# Patient Record
Sex: Male | Born: 1975 | Race: White | Hispanic: No | Marital: Married | Smoking: Current every day smoker
Health system: Southern US, Community
[De-identification: ages and names within clinical notes are randomized; demographics above are authoritative.]

## PROBLEM LIST (undated history)

## (undated) DIAGNOSIS — J45909 Unspecified asthma, uncomplicated: Secondary | ICD-10-CM

---

## 2005-12-01 ENCOUNTER — Emergency Department (HOSPITAL_COMMUNITY): Admission: EM | Admit: 2005-12-01 | Discharge: 2005-12-01 | Payer: Self-pay | Admitting: Emergency Medicine

## 2007-02-18 ENCOUNTER — Emergency Department (HOSPITAL_COMMUNITY): Admission: EM | Admit: 2007-02-18 | Discharge: 2007-02-18 | Payer: Self-pay | Admitting: Family Medicine

## 2007-03-04 ENCOUNTER — Emergency Department (HOSPITAL_COMMUNITY): Admission: EM | Admit: 2007-03-04 | Discharge: 2007-03-04 | Payer: Self-pay | Admitting: Emergency Medicine

## 2008-03-09 ENCOUNTER — Emergency Department (HOSPITAL_COMMUNITY): Admission: EM | Admit: 2008-03-09 | Discharge: 2008-03-09 | Payer: Self-pay | Admitting: Family Medicine

## 2008-03-14 ENCOUNTER — Emergency Department (HOSPITAL_COMMUNITY): Admission: EM | Admit: 2008-03-14 | Discharge: 2008-03-14 | Payer: Self-pay | Admitting: Emergency Medicine

## 2008-04-04 ENCOUNTER — Emergency Department (HOSPITAL_COMMUNITY): Admission: EM | Admit: 2008-04-04 | Discharge: 2008-04-04 | Payer: Self-pay | Admitting: Emergency Medicine

## 2009-03-21 ENCOUNTER — Emergency Department (HOSPITAL_COMMUNITY): Admission: EM | Admit: 2009-03-21 | Discharge: 2009-03-21 | Payer: Self-pay | Admitting: Family Medicine

## 2012-12-09 ENCOUNTER — Encounter (HOSPITAL_COMMUNITY): Payer: Self-pay | Admitting: Emergency Medicine

## 2012-12-09 ENCOUNTER — Emergency Department (HOSPITAL_COMMUNITY)
Admission: EM | Admit: 2012-12-09 | Discharge: 2012-12-09 | Disposition: A | Discharge status: Home / Self Care | Attending: Emergency Medicine | Admitting: Emergency Medicine

## 2012-12-09 ENCOUNTER — Emergency Department (HOSPITAL_COMMUNITY)

## 2012-12-09 DIAGNOSIS — S62339A Displaced fracture of neck of unspecified metacarpal bone, initial encounter for closed fracture: Secondary | ICD-10-CM | POA: Insufficient documentation

## 2012-12-09 DIAGNOSIS — Z79899 Other long term (current) drug therapy: Secondary | ICD-10-CM | POA: Insufficient documentation

## 2012-12-09 DIAGNOSIS — J45901 Unspecified asthma with (acute) exacerbation: Secondary | ICD-10-CM | POA: Insufficient documentation

## 2012-12-09 DIAGNOSIS — S62306A Unspecified fracture of fifth metacarpal bone, right hand, initial encounter for closed fracture: Secondary | ICD-10-CM

## 2012-12-09 HISTORY — DX: Unspecified asthma, uncomplicated: J45.909

## 2012-12-09 MED ORDER — IBUPROFEN 800 MG PO TABS
800.0000 mg | ORAL_TABLET | Freq: Once | ORAL | Status: AC
  Administered 2012-12-09: 800 mg via ORAL
  Filled 2012-12-09: qty 1
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
## 2012-12-18 NOTE — ED Provider Notes (Signed)
Medical screening examination/treatment/procedure(s) were performed by non-physician practitioner and as supervising physician I was immediately available for consultation/collaboration.  Flint Melter, MD 12/18/12 [phone]

## 2015-09-11 ENCOUNTER — Emergency Department (HOSPITAL_COMMUNITY)

## 2015-09-11 ENCOUNTER — Encounter (HOSPITAL_COMMUNITY): Payer: Self-pay | Admitting: *Deleted

## 2015-09-11 ENCOUNTER — Inpatient Hospital Stay (HOSPITAL_COMMUNITY)
Admission: EM | Admit: 2015-09-11 | Discharge: 2015-09-12 | DRG: 071 | Disposition: A | Discharge status: Home / Self Care | Attending: Family Medicine | Admitting: Family Medicine

## 2015-09-11 DIAGNOSIS — I959 Hypotension, unspecified: Secondary | ICD-10-CM | POA: Diagnosis present

## 2015-09-11 DIAGNOSIS — R6510 Systemic inflammatory response syndrome (SIRS) of non-infectious origin without acute organ dysfunction: Secondary | ICD-10-CM | POA: Diagnosis present

## 2015-09-11 DIAGNOSIS — F1910 Other psychoactive substance abuse, uncomplicated: Secondary | ICD-10-CM | POA: Diagnosis not present

## 2015-09-11 DIAGNOSIS — R404 Transient alteration of awareness: Secondary | ICD-10-CM | POA: Diagnosis not present

## 2015-09-11 DIAGNOSIS — R4182 Altered mental status, unspecified: Secondary | ICD-10-CM | POA: Diagnosis present

## 2015-09-11 DIAGNOSIS — F17200 Nicotine dependence, unspecified, uncomplicated: Secondary | ICD-10-CM | POA: Insufficient documentation

## 2015-09-11 DIAGNOSIS — G4710 Hypersomnia, unspecified: Secondary | ICD-10-CM | POA: Diagnosis present

## 2015-09-11 DIAGNOSIS — J45909 Unspecified asthma, uncomplicated: Secondary | ICD-10-CM | POA: Diagnosis present

## 2015-09-11 DIAGNOSIS — G9340 Encephalopathy, unspecified: Principal | ICD-10-CM | POA: Diagnosis present

## 2015-09-11 DIAGNOSIS — R680 Hypothermia, not associated with low environmental temperature: Secondary | ICD-10-CM | POA: Diagnosis present

## 2015-09-11 DIAGNOSIS — R001 Bradycardia, unspecified: Secondary | ICD-10-CM

## 2015-09-11 DIAGNOSIS — T68XXXA Hypothermia, initial encounter: Secondary | ICD-10-CM | POA: Diagnosis not present

## 2015-09-11 DIAGNOSIS — F17210 Nicotine dependence, cigarettes, uncomplicated: Secondary | ICD-10-CM | POA: Diagnosis present

## 2015-09-11 DIAGNOSIS — T68XXXD Hypothermia, subsequent encounter: Secondary | ICD-10-CM | POA: Diagnosis not present

## 2015-09-11 DIAGNOSIS — F1410 Cocaine abuse, uncomplicated: Secondary | ICD-10-CM | POA: Diagnosis present

## 2015-09-11 LAB — URINALYSIS, ROUTINE W REFLEX MICROSCOPIC
Bilirubin Urine: NEGATIVE
Glucose, UA: NEGATIVE mg/dL
Hgb urine dipstick: NEGATIVE
Ketones, ur: NEGATIVE mg/dL
Leukocytes, UA: NEGATIVE
Nitrite: NEGATIVE
Protein, ur: NEGATIVE mg/dL
Specific Gravity, Urine: 1.015 (ref 1.005–1.030)
pH: 6 (ref 5.0–8.0)

## 2015-09-11 LAB — I-STAT CG4 LACTIC ACID, ED: Lactic Acid, Venous: 1.62 mmol/L (ref 0.5–2.0)

## 2015-09-11 LAB — CBC WITH DIFFERENTIAL/PLATELET
BASOS ABS: 0 10*3/uL (ref 0.0–0.1)
BASOS PCT: 0 %
EOS ABS: 0.2 10*3/uL (ref 0.0–0.7)
Eosinophils Relative: 1 %
HCT: 46.4 % (ref 39.0–52.0)
Hemoglobin: 15.3 g/dL (ref 13.0–17.0)
Lymphocytes Relative: 21 %
Lymphs Abs: 2.9 10*3/uL (ref 0.7–4.0)
MCH: 29.4 pg (ref 26.0–34.0)
MCHC: 33 g/dL (ref 30.0–36.0)
MCV: 89.2 fL (ref 78.0–100.0)
MONO ABS: 1.1 10*3/uL — AB (ref 0.1–1.0)
MONOS PCT: 8 %
Neutro Abs: 9.6 10*3/uL — ABNORMAL HIGH (ref 1.7–7.7)
Neutrophils Relative %: 70 %
Platelets: 207 10*3/uL (ref 150–400)
RBC: 5.2 MIL/uL (ref 4.22–5.81)
RDW: 12.4 % (ref 11.5–15.5)
WBC: 13.8 10*3/uL — ABNORMAL HIGH (ref 4.0–10.5)

## 2015-09-11 LAB — COMPREHENSIVE METABOLIC PANEL WITH GFR
ALT: 13 U/L — ABNORMAL LOW (ref 17–63)
AST: 20 U/L (ref 15–41)
Albumin: 3.6 g/dL (ref 3.5–5.0)
Alkaline Phosphatase: 39 U/L (ref 38–126)
Anion gap: 10 (ref 5–15)
BUN: 11 mg/dL (ref 6–20)
CO2: 25 mmol/L (ref 22–32)
Calcium: 9 mg/dL (ref 8.9–10.3)
Chloride: 106 mmol/L (ref 101–111)
Creatinine, Ser: 1.23 mg/dL (ref 0.61–1.24)
GFR calc Af Amer: >60 mL/min (ref >60)
GFR calc non Af Amer: >60 mL/min (ref >60)
Glucose, Bld: 85 mg/dL (ref 65–99)
Potassium: 4.2 mmol/L (ref 3.5–5.1)
Sodium: 141 mmol/L (ref 135–145)
Total Bilirubin: 0.9 mg/dL (ref 0.3–1.2)
Total Protein: 5.8 g/dL — ABNORMAL LOW (ref 6.5–8.1)

## 2015-09-11 LAB — RAPID URINE DRUG SCREEN, HOSP PERFORMED
Amphetamines: NOT DETECTED
Barbiturates: NOT DETECTED
Benzodiazepines: POSITIVE — AB
Cocaine: POSITIVE — AB
Opiates: NOT DETECTED
Tetrahydrocannabinol: POSITIVE — AB

## 2015-09-11 LAB — TROPONIN I
Troponin I: <0.03 ng/mL (ref <0.031)
Troponin I: <0.03 ng/mL (ref <0.031)

## 2015-09-11 LAB — CBG MONITORING, ED: Glucose-Capillary: 74 mg/dL (ref 65–99)

## 2015-09-11 LAB — LACTIC ACID, PLASMA: Lactic Acid, Venous: 0.8 mmol/L (ref 0.5–2.0)

## 2015-09-11 LAB — PROTIME-INR
INR: 1.11 (ref 0.00–1.49)
PROTHROMBIN TIME: 14.5 s (ref 11.6–15.2)

## 2015-09-11 LAB — ETHANOL: Alcohol, Ethyl (B): <5 mg/dL (ref <5)

## 2015-09-11 LAB — MRSA PCR SCREENING: MRSA by PCR: NEGATIVE

## 2015-09-11 MED ORDER — SODIUM CHLORIDE 0.9 % IV BOLUS (SEPSIS)
1000.0000 mL | Freq: Once | INTRAVENOUS | Status: AC
  Administered 2015-09-11: 1000 mL via INTRAVENOUS

## 2015-09-11 MED ORDER — PIPERACILLIN-TAZOBACTAM 3.375 G IVPB
3.3750 g | Freq: Three times a day (TID) | INTRAVENOUS | Status: DC
  Administered 2015-09-12 (×2): 3.375 g via INTRAVENOUS
  Filled 2015-09-11 (×3): qty 50

## 2015-09-11 MED ORDER — ACETAMINOPHEN 650 MG RE SUPP: 650.0000 mg | Freq: Four times a day (QID) | RECTAL | Status: DC | PRN

## 2015-09-11 MED ORDER — ADULT MULTIVITAMIN W/MINERALS CH
1.0000 | ORAL_TABLET | Freq: Every day | ORAL | Status: DC
  Administered 2015-09-11 – 2015-09-12 (×2): 1 via ORAL
  Filled 2015-09-11 (×2): qty 1

## 2015-09-11 MED ORDER — HYDROMORPHONE HCL 1 MG/ML IJ SOLN: 1.0000 mg | Freq: Once | INTRAMUSCULAR | Status: DC

## 2015-09-11 MED ORDER — ACETAMINOPHEN 325 MG PO TABS
650.0000 mg | ORAL_TABLET | Freq: Four times a day (QID) | ORAL | Status: DC | PRN
  Administered 2015-09-12 (×2): 650 mg via ORAL
  Filled 2015-09-11 (×2): qty 2

## 2015-09-11 MED ORDER — VANCOMYCIN HCL IN DEXTROSE 1-5 GM/200ML-% IV SOLN
1000.0000 mg | Freq: Three times a day (TID) | INTRAVENOUS | Status: DC
  Administered 2015-09-12 (×2): 1000 mg via INTRAVENOUS
  Filled 2015-09-11 (×4): qty 200

## 2015-09-11 MED ORDER — ALBUTEROL SULFATE (2.5 MG/3ML) 0.083% IN NEBU
2.5000 mg | INHALATION_SOLUTION | Freq: Four times a day (QID) | RESPIRATORY_TRACT | Status: DC | PRN
  Administered 2015-09-11: 2.5 mg via RESPIRATORY_TRACT
  Filled 2015-09-11: qty 3

## 2015-09-11 MED ORDER — SODIUM CHLORIDE 0.9% FLUSH
3.0000 mL | Freq: Two times a day (BID) | INTRAVENOUS | Status: DC
Start: 2015-09-11 — End: 2015-09-13

## 2015-09-11 MED ORDER — SODIUM CHLORIDE 0.9 % IV SOLN
INTRAVENOUS | Status: DC
  Administered 2015-09-11: 125 mL/h via INTRAVENOUS
  Administered 2015-09-12: 07:00:00 via INTRAVENOUS

## 2015-09-11 MED ORDER — SODIUM CHLORIDE 0.9 % IV SOLN
INTRAVENOUS | Status: AC
  Administered 2015-09-11: 18:00:00 via INTRAVENOUS

## 2015-09-11 MED ORDER — FOLIC ACID 1 MG PO TABS
1.0000 mg | ORAL_TABLET | Freq: Every day | ORAL | Status: DC
  Administered 2015-09-11 – 2015-09-12 (×2): 1 mg via ORAL
  Filled 2015-09-11 (×2): qty 1

## 2015-09-11 MED ORDER — ENOXAPARIN SODIUM 40 MG/0.4ML ~~LOC~~ SOLN
40.0000 mg | SUBCUTANEOUS | Status: DC
Start: 2015-09-11 — End: 2015-09-13
  Administered 2015-09-11: 40 mg via SUBCUTANEOUS
  Filled 2015-09-11: qty 0.4

## 2015-09-11 MED ORDER — PIPERACILLIN-TAZOBACTAM 3.375 G IVPB 30 MIN
3.3750 g | Freq: Once | INTRAVENOUS | Status: AC
  Administered 2015-09-11: 3.375 g via INTRAVENOUS
  Filled 2015-09-11: qty 50

## 2015-09-11 MED ORDER — NALOXONE HCL 0.4 MG/ML IJ SOLN
0.4000 mg | Freq: Once | INTRAMUSCULAR | Status: AC
  Administered 2015-09-11: 0.4 mg via INTRAVENOUS
  Filled 2015-09-11: qty 1

## 2015-09-11 MED ORDER — VANCOMYCIN HCL 10 G IV SOLR
2000.0000 mg | Freq: Once | INTRAVENOUS | Status: AC
  Administered 2015-09-11: 2000 mg via INTRAVENOUS
  Filled 2015-09-11: qty 2000

## 2015-09-11 MED ORDER — VITAMIN B-1 100 MG PO TABS
100.0000 mg | ORAL_TABLET | Freq: Every day | ORAL | Status: DC
  Administered 2015-09-11 – 2015-09-12 (×2): 100 mg via ORAL
  Filled 2015-09-11 (×2): qty 1

## 2015-09-11 MED ORDER — THIAMINE HCL 100 MG/ML IJ SOLN: 100.0000 mg | Freq: Every day | INTRAMUSCULAR | Status: DC

## 2015-09-11 NOTE — ED Notes (Signed)
Pt back from CT.  Attempting to give urine sample.

## 2015-09-11 NOTE — ED Notes (Signed)
Pt wife requested a [place] sandwich, apple sauce and coke for herself. OK per RN Steward DroneBrenda pt wife given a [place] sandwich, apple sauce and coke

## 2015-09-11 NOTE — ED Notes (Signed)
Family at bedside.  They state pt probably overdosed on xanax.  Dr Jeraldine LootsLockwood notified.

## 2015-09-11 NOTE — ED Notes (Signed)
Gerilyn PilgrimJacob Laye [phone].

## 2015-09-11 NOTE — Progress Notes (Signed)
Pharmacy Antibiotic Note  Wesley Larson is a [age] y.o. male admitted on 09/11/2015.  Pharmacy has been consulted for vancomycin and zosyn dosing. Pt is hypothermic and WBC is elevated at 13.8. SCr is 1.23 and lactic acid is 1.62.   Plan: - Vancomycin 2gm IV x 1 then 1gm IV Q8H  - Zosyn 3.375gm IV Q8H (4 hr inf) - F/u renal fxn, C&S, clinical status and trough at SS  Height: 6\' 1"  (185.4 cm) Weight: 215 lb (97.523 kg) IBW/kg (Calculated) : 79.9  Temp (24hrs), Avg:95.8 F (35.4 C), Min:95.8 F (35.4 C), Max:95.8 F (35.4 C)   Recent Labs Lab 09/11/15 1255 09/11/15 1302 09/11/15 1554  WBC 13.8*  --   --   CREATININE 1.23  --   --   LATICACIDVEN  --  1.62 0.8    Estimated Creatinine Clearance: 100.1 mL/min (by C-G formula based on Cr of 1.23).    No Known Allergies  Antimicrobials this admission: Vanc 5/20>> Zosyn 5/20>>  Dose adjustments this admission: N/A  Microbiology results: Pending  Thank you for allowing pharmacy to be a part of this patient's care.  Terrina Docter, Drake LeachRachel Lynn 09/11/2015 4:58 PM
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
## 2015-09-11 NOTE — ED Notes (Signed)
No reaction with narcan.

## 2015-09-11 NOTE — ED Notes (Signed)
Attempted report 

## 2015-09-11 NOTE — ED Notes (Signed)
Admitting MD at bedside.  No neurological improvements/no changes.
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
## 2015-09-12 DIAGNOSIS — R001 Bradycardia, unspecified: Secondary | ICD-10-CM | POA: Diagnosis not present

## 2015-09-12 DIAGNOSIS — T68XXXD Hypothermia, subsequent encounter: Secondary | ICD-10-CM | POA: Diagnosis not present

## 2015-09-12 DIAGNOSIS — F17200 Nicotine dependence, unspecified, uncomplicated: Secondary | ICD-10-CM

## 2015-09-12 DIAGNOSIS — R404 Transient alteration of awareness: Secondary | ICD-10-CM | POA: Diagnosis not present

## 2015-09-12 DIAGNOSIS — F1910 Other psychoactive substance abuse, uncomplicated: Secondary | ICD-10-CM | POA: Insufficient documentation

## 2015-09-12 LAB — HIV ANTIBODY (ROUTINE TESTING W REFLEX): HIV SCREEN 4TH GENERATION: NONREACTIVE

## 2015-09-12 LAB — CBC
HCT: 42.1 % (ref 39.0–52.0)
HEMOGLOBIN: 13.4 g/dL (ref 13.0–17.0)
MCH: 28.3 pg (ref 26.0–34.0)
MCHC: 31.8 g/dL (ref 30.0–36.0)
MCV: 89 fL (ref 78.0–100.0)
Platelets: 189 10*3/uL (ref 150–400)
RBC: 4.73 MIL/uL (ref 4.22–5.81)
RDW: 12.8 % (ref 11.5–15.5)
WBC: 6.7 10*3/uL (ref 4.0–10.5)

## 2015-09-12 LAB — BASIC METABOLIC PANEL
ANION GAP: 7 (ref 5–15)
BUN: 8 mg/dL (ref 6–20)
CALCIUM: 8.2 mg/dL — AB (ref 8.9–10.3)
CHLORIDE: 108 mmol/L (ref 101–111)
CO2: 26 mmol/L (ref 22–32)
Creatinine, Ser: 1.3 mg/dL — ABNORMAL HIGH (ref 0.61–1.24)
GFR calc non Af Amer: >60 mL/min (ref >60)
Glucose, Bld: 115 mg/dL — ABNORMAL HIGH (ref 65–99)
Potassium: 4 mmol/L (ref 3.5–5.1)
Sodium: 141 mmol/L (ref 135–145)

## 2015-09-12 LAB — FOLATE: Folate: 39 ng/mL (ref >5.9)

## 2015-09-12 LAB — RPR: RPR: NONREACTIVE

## 2015-09-12 LAB — TROPONIN I

## 2015-09-12 LAB — TSH: TSH: 1.784 u[IU]/mL (ref 0.350–4.500)

## 2015-09-12 LAB — VITAMIN B12: Vitamin B-12: 196 pg/mL (ref 180–914)

## 2015-09-12 MED ORDER — NICOTINE POLACRILEX 2 MG MT GUM
2.0000 mg | CHEWING_GUM | OROMUCOSAL | Status: DC | PRN
  Filled 2015-09-12: qty 1

## 2015-09-12 MED ORDER — LORAZEPAM 1 MG PO TABS: 1.0000 mg | ORAL_TABLET | Freq: Four times a day (QID) | ORAL | Status: DC | PRN

## 2015-09-12 MED ORDER — ACETAMINOPHEN 325 MG PO TABS: 650.0000 mg | ORAL_TABLET | Freq: Four times a day (QID) | ORAL | Status: DC | PRN

## 2015-09-12 MED ORDER — TRAMADOL HCL 50 MG PO TABS
50.0000 mg | ORAL_TABLET | Freq: Four times a day (QID) | ORAL | Status: DC | PRN
  Administered 2015-09-12: 50 mg via ORAL
  Filled 2015-09-12: qty 1

## 2015-09-12 MED ORDER — NICOTINE 21 MG/24HR TD PT24
21.0000 mg | MEDICATED_PATCH | Freq: Every day | TRANSDERMAL | Status: DC
  Administered 2015-09-12: 21 mg via TRANSDERMAL
  Filled 2015-09-12: qty 1

## 2015-09-12 MED ORDER — LORAZEPAM 2 MG/ML IJ SOLN: 1.0000 mg | Freq: Four times a day (QID) | INTRAMUSCULAR | Status: DC | PRN

## 2015-09-12 NOTE — Progress Notes (Signed)
Utilization review completed.  

## 2015-09-12 NOTE — Progress Notes (Signed)
Patient's IV discontinued, Tele discontinued, CCMD notified, went over the discharge instructions, understood and acknowledged.  He doesn't have a ride and wants to have cab voucher, on coming nurse notified.  His brother is not lifting the phone per patient.
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
## 2015-09-12 NOTE — Discharge Instructions (Signed)
You were admitted for altered mental status.  You had imaging of your brain that showed no acute processes/ injury.  We think that your altered mental status was secondary to substance you and highly encourage you to seek help for this.  Please establish with a primary care doctor so that your health needs can continue to be met.  Consider establishing with [facility] [place], [place], [place] [address] Phone: [phone] or a private provider of your choice.  Confusion Confusion is the inability to think with your usual speed or clarity. Confusion may come on quickly or slowly over time. How quickly the confusion comes on depends on the cause. Confusion can be due to any number of causes. CAUSES   Concussion, head injury, or head trauma.  Seizures.  Stroke.  Fever.  Brain tumor.  Age related decreased brain function (dementia).  Heightened emotional states like rage or terror.  Mental illness in which the person loses the ability to determine what is real and what is not (hallucinations).  Infections such as a urinary tract infection (UTI).  Toxic effects from alcohol, drugs, or prescription medicines.  Dehydration and an imbalance of salts in the body (electrolytes).  Lack of sleep.  Low blood sugar (diabetes).  Low levels of oxygen from conditions such as chronic lung disorders.  Drug interactions or other medicine side effects.  Nutritional deficiencies, especially niacin, thiamine, vitamin C, or vitamin B.  Sudden drop in body temperature (hypothermia).  Change in routine, such as when traveling or hospitalized. SIGNS AND SYMPTOMS  People often describe their thinking as cloudy or unclear when they are confused. Confusion can also include feeling disoriented. That means you are unaware of where or who you are. You may also not know what the date or time is. If confused, you may also have difficulty paying attention, remembering, and  making decisions. Some people also act aggressively when they are confused.  DIAGNOSIS  The medical evaluation of confusion may include:  Blood and urine tests.  X-rays.  Brain and nervous system tests.  Analyzing your brain waves (electroencephalogram or EEG).  Magnetic resonance imaging (MRI) of your head.  Computed tomography (CT) scan of your head.  Mental status tests in which your health care provider may ask many questions. Some of these questions may seem silly or strange, but they are a very important test to help diagnose and treat confusion. TREATMENT  An admission to the hospital may not be needed, but a person with confusion should not be left alone. Stay with a family member or friend until the confusion clears. Avoid alcohol, pain relievers, or sedative drugs until you have fully recovered. Do not drive until directed by your health care provider. HOME CARE INSTRUCTIONS  What family and friends can do:  To find out if someone is confused, ask the person to state his or her name, age, and the date. If the person is unsure or answers incorrectly, he or she is confused.  Always introduce yourself, no matter how well the person knows you.  Often remind the person of his or her location.  Place a calendar and clock near the confused person.  Help the person with his or her medicines. You may want to use a pill box, an alarm as a reminder, or give the person each dose as prescribed.  Talk about current events and plans for the day.  Try to keep the environment calm, quiet, and peaceful.  Make  sure the person keeps follow-up visits with his or her health care provider. PREVENTION  Ways to prevent confusion:  Avoid alcohol.  Eat a balanced diet.  Get enough sleep.  Take medicine only as directed by your health care provider.  Do not become isolated. Spend time with other people and make plans for your days.  Keep careful watch on your blood sugar levels if you  are diabetic. SEEK IMMEDIATE MEDICAL CARE IF:   You develop severe headaches, repeated vomiting, seizures, blackouts, or slurred speech.  There is increasing confusion, weakness, numbness, restlessness, or personality changes.  You develop a loss of balance, have marked dizziness, feel uncoordinated, or fall.  You have delusions, hallucinations, or develop severe anxiety.  Your family members think you need to be rechecked.   This information is not intended to replace advice given to you by your health care provider. Make sure you discuss any questions you have with your health care provider.   Document Released: 05/18/2004 Document Revised: 05/01/2014 Document Reviewed: 05/16/2013 Elsevier Interactive Patient Education [employer].

## 2015-09-12 NOTE — Progress Notes (Signed)
Transferred to 6C13 per wheelchair in stable condition.

## 2015-09-12 NOTE — Progress Notes (Signed)
Went to patient's room and found that his urinal need emptying.  I emptied the urinal and found some coffee ground stuff on the floor and at the urinal holder.  I asked if patient is using a snuff.  Wife stated that she told him that he is not supposed to use it.  No snuff noted at that time.

## 2015-09-12 NOTE — Progress Notes (Signed)
I knocked at the door and wife stated, "just a minute."  I asked her if he is using the urinal, negative.  Noted that patient is on the phone and using snuff.  Informed patient that snuff is not allowed here and encouraged him to spit it out.  He did spit it out.  He and his wife stated that they thought that cigarette is the only thing that is not allowed.  Informed them that it is a form of tobacco and it is not allowed in this premises.
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
## 2015-09-16 LAB — CULTURE, BLOOD (ROUTINE X 2)
Culture: NO GROWTH
Culture: NO GROWTH

## 2016-01-19 ENCOUNTER — Emergency Department (HOSPITAL_COMMUNITY)
Admission: EM | Admit: 2016-01-19 | Discharge: 2016-01-19 | Disposition: A | Discharge status: Home / Self Care | Payer: Self-pay | Attending: Emergency Medicine | Admitting: Emergency Medicine

## 2016-01-19 ENCOUNTER — Encounter (HOSPITAL_COMMUNITY): Payer: Self-pay | Admitting: *Deleted

## 2016-01-19 ENCOUNTER — Emergency Department (HOSPITAL_COMMUNITY): Payer: Self-pay

## 2016-01-19 DIAGNOSIS — F17210 Nicotine dependence, cigarettes, uncomplicated: Secondary | ICD-10-CM | POA: Insufficient documentation

## 2016-01-19 DIAGNOSIS — J45901 Unspecified asthma with (acute) exacerbation: Secondary | ICD-10-CM | POA: Insufficient documentation

## 2016-01-19 DIAGNOSIS — J069 Acute upper respiratory infection, unspecified: Secondary | ICD-10-CM | POA: Insufficient documentation

## 2016-01-19 MED ORDER — KETOROLAC TROMETHAMINE 60 MG/2ML IM SOLN
60.0000 mg | Freq: Once | INTRAMUSCULAR | Status: AC
  Administered 2016-01-19: 60 mg via INTRAMUSCULAR
  Filled 2016-01-19: qty 2

## 2016-01-19 MED ORDER — IPRATROPIUM-ALBUTEROL 0.5-2.5 (3) MG/3ML IN SOLN: 3.0000 mL | RESPIRATORY_TRACT | Status: DC

## 2016-01-19 MED ORDER — ALBUTEROL SULFATE HFA 108 (90 BASE) MCG/ACT IN AERS
2.0000 | INHALATION_SPRAY | Freq: Once | RESPIRATORY_TRACT | Status: AC
  Administered 2016-01-19: 2 via RESPIRATORY_TRACT
  Filled 2016-01-19: qty 6.7

## 2016-01-19 MED ORDER — PREDNISONE 10 MG PO TABS: 40.0000 mg | ORAL_TABLET | Freq: Every day | ORAL | 0 refills | Status: AC

## 2016-01-19 MED ORDER — PREDNISONE 20 MG PO TABS
60.0000 mg | ORAL_TABLET | Freq: Once | ORAL | Status: AC
  Administered 2016-01-19: 60 mg via ORAL
  Filled 2016-01-19: qty 3

## 2016-01-19 MED ORDER — CYCLOBENZAPRINE HCL 10 MG PO TABS
5.0000 mg | ORAL_TABLET | Freq: Once | ORAL | Status: AC
  Administered 2016-01-19: 5 mg via ORAL
  Filled 2016-01-19: qty 1

## 2016-01-19 MED ORDER — BENZONATATE 100 MG PO CAPS: 100.0000 mg | ORAL_CAPSULE | Freq: Three times a day (TID) | ORAL | 0 refills | Status: DC

## 2016-01-19 MED ORDER — CYCLOBENZAPRINE HCL 10 MG PO TABS: 10.0000 mg | ORAL_TABLET | Freq: Two times a day (BID) | ORAL | 0 refills | Status: DC | PRN

## 2016-01-19 MED ORDER — IPRATROPIUM-ALBUTEROL 0.5-2.5 (3) MG/3ML IN SOLN
3.0000 mL | RESPIRATORY_TRACT | Status: AC
  Administered 2016-01-19 (×2): 3 mL via RESPIRATORY_TRACT
  Filled 2016-01-19: qty 6

## 2016-01-19 NOTE — ED Provider Notes (Signed)
[facility]-EMER[facility]Y DEPT Provider Note   CSN: [id number] Arrival date & time: 01/19/16  0741     History   Chief Complaint Chief Complaint  Patient presents with  . Influenza    HPI Wesley Larson is a 40 y.o. male.  HPI   Coughing, chest congestion, don't have inhaler because can't afford one but has hx of asthma Cough for a few days, woke up Saturday AM feeling badly, girlfriend had cold and passed it to him and it feels worse because asthma Nonproductive Cold sweats, feeling feverish at home, taking ibuprofen to try to break fever, taking robitussin and claritin D, no abx Diarrhea this AM  Emesis x3 this AM Tried to drink some water and take ibuprofen but threw it up   Past Medical History:  Diagnosis Date  . Asthma     Patient Active Problem List   Diagnosis Date Noted  . Tobacco use disorder   . Polysubstance abuse   . Altered mental status 09/11/2015  . Altered level of consciousness   . Hypothermia   . Bradycardia   . Drug abuse     History reviewed. No pertinent surgical history.     Home Medications    Prior to Admission medications   Medication Sig Start Date End Date Taking? Authorizing Provider  ibuprofen (ADVIL,MOTRIN) 200 MG tablet Take 400 mg by mouth every 6 (six) hours as needed for fever or mild pain.   Yes Historical Provider, MD  benzonatate (TESSALON) 100 MG capsule Take 1 capsule (100 mg total) by mouth every 8 (eight) hours. 01/19/16   Alvira MondayErin Latoiya Maradiaga, MD  cyclobenzaprine (FLEXERIL) 10 MG tablet Take 1 tablet (10 mg total) by mouth 2 (two) times daily as needed for muscle spasms. 01/19/16   Alvira MondayErin Mende Biswell, MD  predniSONE (DELTASONE) 10 MG tablet Take 4 tablets (40 mg total) by mouth daily. 01/19/16 01/23/16  Alvira MondayErin Kanna Dafoe, MD    Family History No family history on file.  Social History Social History  Substance Use Topics  . Smoking status: Current Every Day Smoker    Packs/day: 2.00    Types: Cigarettes  . Smokeless tobacco:  Never Used  . Alcohol use No     Comment: a lot     Allergies   Review of patient's allergies indicates no known allergies.   Review of Systems Review of Systems  Constitutional: Positive for chills and fever (subjective).  HENT: Negative for sore throat.   Eyes: Negative for visual disturbance.  Respiratory: Positive for cough, shortness of breath and wheezing.   Cardiovascular: Negative for chest pain.  Gastrointestinal: Positive for diarrhea, nausea and vomiting. Negative for abdominal pain.  Genitourinary: Negative for difficulty urinating.  Musculoskeletal: Positive for myalgias (body aches). Negative for back pain and neck stiffness.  Skin: Negative for rash.  Neurological: Negative for syncope and headaches.     Physical Exam Updated Vital Signs BP 126/72 (BP Location: Right Arm)   Pulse 76   Temp 98 F (36.7 C) (Oral)   Resp 18   Ht 5\' 9"  (1.753 m)   Wt 190 lb (86.2 kg)   SpO2 100%   BMI 28.06 kg/m   Physical Exam  Constitutional: He is oriented to person, place, and time. He appears well-developed and well-nourished. No distress.  HENT:  Head: Normocephalic and atraumatic.  Eyes: Conjunctivae and EOM are normal.  Neck: Normal range of motion.  Cardiovascular: Normal rate, regular rhythm, normal heart sounds and intact distal pulses.  Exam reveals no gallop  and no friction rub.   No murmur heard. Pulmonary/Chest: Effort normal. No respiratory distress. He has wheezes. He has no rales.  Abdominal: Soft. He exhibits no distension. There is no tenderness. There is no guarding.  Musculoskeletal: He exhibits no edema.  Neurological: He is alert and oriented to person, place, and time.  Skin: Skin is warm and dry. He is not diaphoretic.  Nursing note and vitals reviewed.    ED Treatments / Results  Labs (all labs ordered are listed, but only abnormal results are displayed) Labs Reviewed - No data to display  EKG  EKG Interpretation None        Radiology Dg Chest 2 View  Result Date: 01/19/2016 CLINICAL DATA:  Cough and fever for 5 days. EXAM: CHEST  2 VIEW COMPARISON:  09/11/2015 FINDINGS: Normal heart size and mediastinal contours. No acute infiltrate or edema. No effusion or pneumothorax. No acute osseous findings. IMPRESSION: No evidence of pneumonia. Electronically Signed   By: Marnee Spring M.D.   On: 01/19/2016 08:29    Procedures Procedures (including critical care time)  Medications Ordered in ED Medications  predniSONE (DELTASONE) tablet 60 mg (60 mg Oral Given 01/19/16 0912)  ipratropium-albuterol (DUONEB) 0.5-2.5 (3) MG/3ML nebulizer solution 3 mL (3 mLs Nebulization Given 01/19/16 0953)  cyclobenzaprine (FLEXERIL) tablet 5 mg (5 mg Oral Given 01/19/16 0912)  ketorolac (TORADOL) injection 60 mg (60 mg Intramuscular Given 01/19/16 0912)  albuterol (PROVENTIL HFA;VENTOLIN HFA) 108 (90 Base) MCG/ACT inhaler 2 puff (2 puffs Inhalation Given 01/19/16 0912)     Initial Impression / Assessment and Plan / ED Course  I have reviewed the triage vital signs and the nursing notes.  Pertinent labs & imaging results that were available during my care of the patient were reviewed by me and considered in my medical decision making (see chart for details).  Clinical Course   40yo male with history of asthma presents with cough, wheezing, n/v, diarrhea.  XR without pneumonia. Pt appears hydrated, abd exam benign. Suspect viral syndrome with asthma exacerbation.  Patient with wheezing on exam. Given duonebs x2 and albuterol MDI with improvement. Given prednisone 60mg  and rx for 40mg  x 4 days for asthma exacerbation.  Recommend PCP follow up. Patient discharged in stable condition with understanding of reasons to return.   Final Clinical Impressions(s) / ED Diagnoses   Final diagnoses:  Asthma exacerbation  URI (upper respiratory infection)    New Prescriptions Discharge Medication List as of 01/19/2016 11:06 AM    START  taking these medications   Details  benzonatate (TESSALON) 100 MG capsule Take 1 capsule (100 mg total) by mouth every 8 (eight) hours., Starting Wed 01/19/2016, Print    cyclobenzaprine (FLEXERIL) 10 MG tablet Take 1 tablet (10 mg total) by mouth 2 (two) times daily as needed for muscle spasms., Starting Wed 01/19/2016, Print    predniSONE (DELTASONE) 10 MG tablet Take 4 tablets (40 mg total) by mouth daily., Starting Wed 01/19/2016, Until Sun 01/23/2016, Print         Alvira Monday, MD 01/20/16 1057

## 2016-01-19 NOTE — ED Triage Notes (Signed)
Pt here with cough, wheezing, fever, N/V x 4 days.  Pt advises he is taking motrin for pain and fever.  Took 400 of motrin at 6:30am.

## 2016-01-19 NOTE — ED Notes (Signed)
Isolation mask placed on pt, pt to xray.

## 2016-08-31 ENCOUNTER — Emergency Department (HOSPITAL_COMMUNITY): Payer: Self-pay

## 2016-08-31 ENCOUNTER — Encounter (HOSPITAL_COMMUNITY): Payer: Self-pay

## 2016-08-31 ENCOUNTER — Emergency Department (HOSPITAL_COMMUNITY)
Admission: EM | Admit: 2016-08-31 | Discharge: 2016-08-31 | Disposition: A | Discharge status: Home / Self Care | Payer: Self-pay | Attending: Emergency Medicine | Admitting: Emergency Medicine

## 2016-08-31 DIAGNOSIS — S93402A Sprain of unspecified ligament of left ankle, initial encounter: Secondary | ICD-10-CM | POA: Insufficient documentation

## 2016-08-31 DIAGNOSIS — Y990 Civilian activity done for income or pay: Secondary | ICD-10-CM | POA: Insufficient documentation

## 2016-08-31 DIAGNOSIS — Y929 Unspecified place or not applicable: Secondary | ICD-10-CM | POA: Insufficient documentation

## 2016-08-31 DIAGNOSIS — J45909 Unspecified asthma, uncomplicated: Secondary | ICD-10-CM | POA: Insufficient documentation

## 2016-08-31 DIAGNOSIS — Y9389 Activity, other specified: Secondary | ICD-10-CM | POA: Insufficient documentation

## 2016-08-31 DIAGNOSIS — W11XXXA Fall on and from ladder, initial encounter: Secondary | ICD-10-CM | POA: Insufficient documentation

## 2016-08-31 DIAGNOSIS — F17210 Nicotine dependence, cigarettes, uncomplicated: Secondary | ICD-10-CM | POA: Insufficient documentation

## 2016-08-31 MED ORDER — KETOROLAC TROMETHAMINE 60 MG/2ML IM SOLN
60.0000 mg | Freq: Once | INTRAMUSCULAR | Status: AC
  Administered 2016-08-31: 60 mg via INTRAMUSCULAR
  Filled 2016-08-31: qty 2

## 2016-08-31 NOTE — ED Notes (Signed)
Patient transported to X-ray 

## 2016-08-31 NOTE — ED Notes (Signed)
Ortho Tech at bedside.  

## 2016-08-31 NOTE — ED Triage Notes (Signed)
Pt. Was at work and was on a ladder and slipped and fell onto lt. Ankle.  Pt. Applied ice and elevation all night.  Pt. 's lt. Ankle is swollen , ecchymotic.  Able to move toes and +pedal pulse
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
## 2016-08-31 NOTE — Discharge Instructions (Signed)
Please follow up with Dr. Logan BoresEvans regarding today's visit in 1-2 weeks. Please use cam boot for most of the day as well as crutches. Please take ibuprofen or naproxen as needed for pain and swelling. Continue rest, ice, compression, elevation.  Contact a health care provider if: You have rapidly increasing bruising or swelling. Your pain is not relieved with medicine. Get help right away if: Your toes or foot becomes numb or blue. You have severe pain that gets worse.

## 2016-08-31 NOTE — ED Notes (Signed)
Paged ortho tech 

## 2016-08-31 NOTE — Progress Notes (Signed)
Orthopedic Tech Progress Note Patient Details:  Wesley Larson 02/18/1976 [id number]  Ortho Devices Type of Ortho Device: CAM walker, Crutches Ortho Device/Splint Interventions: Application   Wesley Larson 08/31/2016, 12:43 PM

## 2017-01-02 ENCOUNTER — Emergency Department (HOSPITAL_COMMUNITY)
Admission: EM | Admit: 2017-01-02 | Discharge: 2017-01-02 | Disposition: A | Discharge status: Home / Self Care | Payer: Self-pay | Attending: Emergency Medicine | Admitting: Emergency Medicine

## 2017-01-02 ENCOUNTER — Emergency Department (HOSPITAL_COMMUNITY): Payer: Self-pay

## 2017-01-02 ENCOUNTER — Encounter (HOSPITAL_COMMUNITY): Payer: Self-pay

## 2017-01-02 DIAGNOSIS — N50811 Right testicular pain: Secondary | ICD-10-CM

## 2017-01-02 DIAGNOSIS — F17210 Nicotine dependence, cigarettes, uncomplicated: Secondary | ICD-10-CM | POA: Insufficient documentation

## 2017-01-02 DIAGNOSIS — R1031 Right lower quadrant pain: Secondary | ICD-10-CM | POA: Insufficient documentation

## 2017-01-02 NOTE — ED Provider Notes (Signed)
[facility]-EMERGENCY DEPT Provider Note   CSN: [id number] Arrival date & time: 01/02/17  0907     History   Chief Complaint No chief complaint on file.   HPI Charisse Klinefelterimothy R Hirano is a 41 y.o. male.  HPI   Charisse Klinefelterimothy R Radice is a 41 y.o. male, with a history of Asthma, presenting to the ED with right inguinal pain intermittent for the last month. States he also thinks he feels a bulge in the area. 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 Pain comes on with lifting and twisting his torso. Pain is rated 9/10, shooting, radiating toward the right testicle.  Began to have lingering right testicular pain yesterday, Mild, aching. His testicular pain is mild at baseline, but increases with movement. Denies fever/chills, abdominal pain, difficulty or pain with urination, penile discharge, pain with bowel movements, N/V/D, or any other complaints.     Past Medical History:  Diagnosis Date  . Asthma     Patient Active Problem List   Diagnosis Date Noted  . Tobacco use disorder   . Polysubstance abuse   . Altered mental status 09/11/2015  . Altered level of consciousness   . Hypothermia   . Bradycardia   . Drug abuse     History reviewed. No pertinent surgical history.     Home Medications    Prior to Admission medications   Medication Sig Start Date End Date Taking? Authorizing Provider  benzonatate (TESSALON) 100 MG capsule Take 1 capsule (100 mg total) by mouth every 8 (eight) hours. 01/19/16   Alvira MondaySchlossman, Erin, MD  cyclobenzaprine (FLEXERIL) 10 MG tablet Take 1 tablet (10 mg total) by mouth 2 (two) times daily as needed for muscle spasms. 01/19/16   Alvira MondaySchlossman, Erin, MD  ibuprofen (ADVIL,MOTRIN) 200 MG tablet Take 400 mg by mouth every 6 (six) hours as needed for fever or mild pain.    [provider]    Family History No family history on file.  Social History Social History  Substance Use Topics  . Smoking status: Current Every Day Smoker    Packs/day:  2.00    Types: Cigarettes  . Smokeless tobacco: Never Used  . Alcohol use No     Comment: a lot     Allergies   Patient has no known allergies.   Review of Systems Review of Systems  Constitutional: Negative for chills and fever.  Respiratory: Negative for shortness of breath.   Cardiovascular: Negative for chest pain.  Gastrointestinal: Negative for abdominal pain, diarrhea, nausea and vomiting.  Genitourinary: Positive for testicular pain. Negative for decreased urine volume, difficulty urinating, discharge, dysuria, flank pain, hematuria, penile pain and penile swelling.       Right groin pain  Musculoskeletal: Negative for back pain.  Skin: Negative for rash.  All other systems reviewed and are negative.    Physical Exam Updated Vital Signs BP 118/81 (BP Location: Left Arm)   Pulse 62   Temp 98.2 F (36.8 C) (Oral)   Resp 17   Ht 5\' 9"  (1.753 m)   Wt 86.2 kg (190 lb)   SpO2 97%   BMI 28.06 kg/m   Physical Exam  Constitutional: He appears well-developed and well-nourished. No distress.  HENT:  Head: Normocephalic and atraumatic.  Eyes: Conjunctivae are normal.  Neck: Neck supple.  Cardiovascular: Normal rate, regular rhythm and intact distal pulses.   Pulmonary/Chest: Effort normal. No respiratory distress.  Abdominal: Soft. There is no tenderness. There is no guarding.  Genitourinary:  Genitourinary Comments: Right inguinal external and internal tenderness. Suspicion for right inguinal hernia on exam. No externally noted swelling, erythema, or ecchymosis. No noted testicular or scrotal swelling. No testicular elevation. Penis without swelling, lesions, or tenderness. No penile discharge. Cremasteric reflex intact. No inguinal lymphadenopathy. Otherwise normal male genitalia.   Musculoskeletal: He exhibits no edema.  Lymphadenopathy:    He has no cervical adenopathy.  Neurological: He is alert.  Skin: Skin is warm and dry. He is not diaphoretic.    Psychiatric: He has a normal mood and affect. His behavior is normal.  Nursing note and vitals reviewed.    ED Treatments / Results  Labs (all labs ordered are listed, but only abnormal results are displayed) Labs Reviewed - No data to display  EKG  EKG Interpretation None       Radiology US Scrotum  Result Date: 01/02/2017 CLINICAL DATA:  41 year old male with intermittent right inguinal and right testicular pain for several weeks. EXAM: SCROTAL ULTRASOUND DOPPLER ULTRASOUND OF THE TESTICLES TECHNIQUE: Complete ultrasound examination of the testicles, epididymis, and other scrotal structures was performed. Color and spectral Doppler ultrasound were also utilized to evaluate blood flow to the testicles. COMPARISON:  None. FINDINGS: Right testicle Measurements: 3.9 x 2.2 x 2.7 cm. No mass or microlithiasis visualized. Left testicle Measurements: 3.7 x 1.9 x 2.4 cm. No mass or microlithiasis visualized. Right epididymis:  Normal in size and appearance. Left epididymis:  Normal in size and appearance. Hydrocele: Trace bilateral simple appearing hydroceles, appear inconsequential. Varicocele:  None visualized. Pulsed Doppler interrogation of both testes demonstrates normal low resistance arterial and venous waveforms bilaterally. IMPRESSION: No evidence of testicular torsion and essentially normal scrotal ultrasound with Doppler. Electronically Signed   By: Odessa Fleming M.D.   On: 01/02/2017 11:35   [place] Scrotom Doppler  Result Date: 01/02/2017 CLINICAL DATA:  41 year old male with intermittent right inguinal and right testicular pain for several weeks. EXAM: SCROTAL ULTRASOUND DOPPLER ULTRASOUND OF THE TESTICLES TECHNIQUE: Complete ultrasound examination of the testicles, epididymis, and other scrotal structures was performed. Color and spectral Doppler ultrasound were also utilized to evaluate blood flow to the testicles. COMPARISON:  None. FINDINGS: Right testicle Measurements: 3.9 x 2.2 x 2.7 cm.  No mass or microlithiasis visualized. Left testicle Measurements: 3.7 x 1.9 x 2.4 cm. No mass or microlithiasis visualized. Right epididymis:  Normal in size and appearance. Left epididymis:  Normal in size and appearance. Hydrocele: Trace bilateral simple appearing hydroceles, appear inconsequential. Varicocele:  None visualized. Pulsed Doppler interrogation of both testes demonstrates normal low resistance arterial and venous waveforms bilaterally. IMPRESSION: No evidence of testicular torsion and essentially normal scrotal ultrasound with Doppler. Electronically Signed   By: Odessa Fleming M.D.   On: 01/02/2017 11:35    Procedures Procedures (including critical care time)  Medications Ordered in ED Medications - No data to display   Initial Impression / Assessment and Plan / ED Course  I have reviewed the triage vital signs and the nursing notes.  Pertinent labs & imaging results that were available during my care of the patient were reviewed by me and considered in my medical decision making (see chart for details).     Patient presents with right inguinal pain with straining or lifting. Suspected right inguinal hernia on exam. No acute abnormalities on ultrasound. Patient remained pain-free throughout his ED course. General surgery follow-up outpatient. The patient was given instructions for home care as well as return precautions. Patient voices understanding of these instructions,  accepts the plan, and is comfortable with discharge.     Final Clinical Impressions(s) / ED Diagnoses   Final diagnoses:  Right inguinal pain    New Prescriptions New Prescriptions   No medications on file     Concepcion Living 01/02/17 1157    Pricilla Loveless, MD 01/05/17 1119

## 2017-01-02 NOTE — Discharge Instructions (Signed)
There is a suspected hernia on exam. There were no abnormalities on the ultrasound. May use ibuprofen, Tylenol, or naproxen for pain. Avoid lifting or twisting, if possible. Follow-up with the general surgeon as soon as possible on this matter. Call the number provided to set up an appointment. Return to the ED immediately should symptoms worsen or fail to subside.

## 2017-01-02 NOTE — ED Triage Notes (Signed)
Patient complains of intermittent right groin pain for several weeks. Does a lot of lifting for work and thinks he may have a hernia. Minimal swelling, no dysuria

## 2017-01-02 NOTE — ED Notes (Signed)
Pt to ultrasound

## 2017-01-02 NOTE — ED Notes (Signed)
MD to see and assess patient prior to RN assessment. See EDP note.

## 2017-01-02 NOTE — ED Notes (Signed)
Patient verbalized understanding of discharge instructions and denies any further needs or questions at this time. VS stable. Patient ambulatory with steady gait. Escorted to ED entrance.  

## 2017-09-03 ENCOUNTER — Other Ambulatory Visit: Payer: Self-pay

## 2017-09-03 ENCOUNTER — Emergency Department (HOSPITAL_COMMUNITY): Payer: Self-pay

## 2017-09-03 ENCOUNTER — Emergency Department (HOSPITAL_COMMUNITY)
Admission: EM | Admit: 2017-09-03 | Discharge: 2017-09-03 | Disposition: A | Discharge status: Home / Self Care | Payer: Self-pay | Attending: Emergency Medicine | Admitting: Emergency Medicine

## 2017-09-03 ENCOUNTER — Encounter (HOSPITAL_COMMUNITY): Payer: Self-pay

## 2017-09-03 DIAGNOSIS — Z720 Tobacco use: Secondary | ICD-10-CM

## 2017-09-03 DIAGNOSIS — F17210 Nicotine dependence, cigarettes, uncomplicated: Secondary | ICD-10-CM | POA: Insufficient documentation

## 2017-09-03 DIAGNOSIS — J4521 Mild intermittent asthma with (acute) exacerbation: Secondary | ICD-10-CM | POA: Insufficient documentation

## 2017-09-03 DIAGNOSIS — R059 Cough, unspecified: Secondary | ICD-10-CM

## 2017-09-03 DIAGNOSIS — J069 Acute upper respiratory infection, unspecified: Secondary | ICD-10-CM | POA: Insufficient documentation

## 2017-09-03 DIAGNOSIS — R05 Cough: Secondary | ICD-10-CM

## 2017-09-03 DIAGNOSIS — J45909 Unspecified asthma, uncomplicated: Secondary | ICD-10-CM | POA: Insufficient documentation

## 2017-09-03 MED ORDER — IPRATROPIUM BROMIDE 0.02 % IN SOLN
0.5000 mg | Freq: Once | RESPIRATORY_TRACT | Status: AC
  Administered 2017-09-03: 0.5 mg via RESPIRATORY_TRACT
  Filled 2017-09-03: qty 2.5

## 2017-09-03 MED ORDER — ALBUTEROL SULFATE (2.5 MG/3ML) 0.083% IN NEBU
5.0000 mg | INHALATION_SOLUTION | Freq: Once | RESPIRATORY_TRACT | Status: AC
  Administered 2017-09-03: 5 mg via RESPIRATORY_TRACT
  Filled 2017-09-03: qty 6

## 2017-09-03 MED ORDER — ALBUTEROL SULFATE HFA 108 (90 BASE) MCG/ACT IN AERS
2.0000 | INHALATION_SPRAY | Freq: Once | RESPIRATORY_TRACT | Status: AC
  Administered 2017-09-03: 2 via RESPIRATORY_TRACT
  Filled 2017-09-03: qty 6.7

## 2017-09-03 MED ORDER — ALBUTEROL SULFATE (2.5 MG/3ML) 0.083% IN NEBU
5.0000 mg | INHALATION_SOLUTION | Freq: Once | RESPIRATORY_TRACT | Status: AC
Start: 2017-09-03 — End: 2017-09-03
  Administered 2017-09-03: 5 mg via RESPIRATORY_TRACT
  Filled 2017-09-03: qty 6

## 2017-09-03 MED ORDER — PREDNISONE 20 MG PO TABS
60.0000 mg | ORAL_TABLET | Freq: Once | ORAL | Status: AC
  Administered 2017-09-03: 60 mg via ORAL
  Filled 2017-09-03: qty 3

## 2017-09-03 MED ORDER — ALBUTEROL SULFATE HFA 108 (90 BASE) MCG/ACT IN AERS: 1.0000 | INHALATION_SPRAY | RESPIRATORY_TRACT | 0 refills | Status: AC | PRN

## 2017-09-03 MED ORDER — PREDNISONE 20 MG PO TABS: ORAL_TABLET | ORAL | 0 refills | Status: DC

## 2017-09-03 NOTE — Discharge Instructions (Addendum)
Continue to stay well-hydrated. Use Mucinex for cough suppression/expectoration of mucus. Use over the counter antihistamines such as zyrtec, claritin, or allegra to decrease symptoms and frequency of asthma attacks. Use flonase and the netipot to help with nasal congestion. Use inhaler as directed, as needed for cough/chest congestion/wheezing/shortness of breath/etc. Take prednisone as directed for your asthma exacerbation, starting tomorrow since you received today's dose in the ER today. STOP SMOKING! [facility] in 5-7 days for recheck of ongoing symptoms and to establish medical care. Return to emergency department for emergent changing or worsening of symptoms.

## 2017-09-03 NOTE — ED Provider Notes (Signed)
[facility] EMERGENCY DEPARTMENT Provider Note   CSN: [id number] Arrival date & time: 09/03/17  0746     History   Chief Complaint Chief Complaint  Patient presents with  . Cough  . Shortness of Breath    HPI Wesley Larson is a 42 y.o. male with a PMHx of asthma, who presents to the ED with complaints of "asthma acting up and a cold".  Patient states that 3 days ago he started having a nonproductive cough, wheezing, shortness of breath, chills, rhinorrhea, and body aches.  He has tried hot showers, cough drops, and ibuprofen with no relief of his symptoms, no known aggravating factors.  He states that this feels like his "asthma as well as a cold on top of it".  He admits to being a cigarette smoker.  No known sick contacts.  He denies any sore throat, ear pain or drainage, fevers, chest pain, hemoptysis, leg swelling, abd pain, N/V/D/C, hematuria, dysuria, arthralgias, numbness, tingling, focal weakness, or any other complaints at this time.   The history is provided by the patient and medical records. No language interpreter was used.  Cough  Associated symptoms include chills, rhinorrhea, myalgias, shortness of breath and wheezing. Pertinent negatives include no chest pain, no ear pain and no sore throat.  Shortness of Breath  Associated symptoms include rhinorrhea, cough and wheezing. Pertinent negatives include no fever, no sore throat, no ear pain, no chest pain, no vomiting, no abdominal pain and no leg swelling.    Past Medical History:  Diagnosis Date  . Asthma     Patient Active Problem List   Diagnosis Date Noted  . Tobacco use disorder   . Polysubstance abuse (HCC)   . Altered mental status 09/11/2015  . Altered level of consciousness   . Hypothermia   . Bradycardia   . Drug abuse (HCC)     History reviewed. No pertinent surgical history.      Home Medications    Prior to Admission medications   Medication Sig Start Date End Date  Taking? Authorizing Provider  benzonatate (TESSALON) 100 MG capsule Take 1 capsule (100 mg total) by mouth every 8 (eight) hours. 01/19/16   Alvira Monday, MD  cyclobenzaprine (FLEXERIL) 10 MG tablet Take 1 tablet (10 mg total) by mouth 2 (two) times daily as needed for muscle spasms. 01/19/16   Alvira Monday, MD  ibuprofen (ADVIL,MOTRIN) 200 MG tablet Take 400 mg by mouth every 6 (six) hours as needed for fever or mild pain.    [provider]    Family History History reviewed. No pertinent family history.  Social History Social History   Tobacco Use  . Smoking status: Current Every Day Smoker    Packs/day: 2.00    Types: Cigarettes  . Smokeless tobacco: Never Used  Substance Use Topics  . Alcohol use: No    Comment: a lot  . Drug use: No     Allergies   Patient has no known allergies.   Review of Systems Review of Systems  Constitutional: Positive for chills. Negative for fever.  HENT: Positive for rhinorrhea. Negative for ear discharge, ear pain and sore throat.   Respiratory: Positive for cough, shortness of breath and wheezing.   Cardiovascular: Negative for chest pain and leg swelling.  Gastrointestinal: Negative for abdominal pain, constipation, diarrhea, nausea and vomiting.  Genitourinary: Negative for dysuria and hematuria.  Musculoskeletal: Positive for myalgias. Negative for arthralgias.  Skin: Negative for color change.  Allergic/Immunologic: Negative  for immunocompromised state.  Neurological: Negative for weakness and numbness.  Psychiatric/Behavioral: Negative for confusion.   All other systems reviewed and are negative for acute change except as noted in the HPI.    Physical Exam Updated Vital Signs BP 130/80 (BP Location: Right Arm)   Pulse 76   Temp 98.3 F (36.8 C) (Oral)   Resp 20   SpO2 98%   Physical Exam  Constitutional: He is oriented to person, place, and time. Vital signs are normal. He appears well-developed and  well-nourished.  Non-toxic appearance. No distress.  Afebrile, nontoxic, NAD  HENT:  Head: Normocephalic and atraumatic.  Mouth/Throat: Oropharynx is clear and moist and mucous membranes are normal.  Eyes: Conjunctivae and EOM are normal. Right eye exhibits no discharge. Left eye exhibits no discharge.  Neck: Normal range of motion. Neck supple.  Cardiovascular: Normal rate, regular rhythm, normal heart sounds and intact distal pulses. Exam reveals no gallop and no friction rub.  No murmur heard. Pulmonary/Chest: Effort normal. No respiratory distress. He has no decreased breath sounds. He has wheezes. He has rhonchi. He has no rales.  Strong cough during exam. Diffuse expiratory wheezing and scattered rhonchi throughout, no rales, no hypoxia or increased WOB, speaking in full sentences, SpO2 98% on RA   Abdominal: Soft. Normal appearance and bowel sounds are normal. He exhibits no distension. There is no tenderness. There is no rigidity, no rebound, no guarding, no CVA tenderness, no tenderness at McBurney's point and negative Murphy's sign.  Musculoskeletal: Normal range of motion.  Neurological: He is alert and oriented to person, place, and time. He has normal strength. No sensory deficit.  Skin: Skin is warm, dry and intact. No rash noted.  Psychiatric: He has a normal mood and affect.  Nursing note and vitals reviewed.    ED Treatments / Results  Labs (all labs ordered are listed, but only abnormal results are displayed) Labs Reviewed - No data to display  EKG EKG Interpretation  Date/Time:  Monday Sep 03 2017 12:14:22 EDT Ventricular Rate:  73 PR Interval:    QRS Duration: 102 QT Interval:  379 QTC Calculation: 418 R Axis:   74 Text Interpretation:  Sinus rhythm Confirmed by Kristine Royal [id number]) on 09/03/2017 12:20:47 PM   Radiology Dg Chest 2 View  Result Date: 09/03/2017 CLINICAL DATA:  Shortness of Breath EXAM: CHEST - 2 VIEW COMPARISON:  01/19/2016 FINDINGS: Heart  and mediastinal contours are within normal limits. No focal opacities or effusions. No acute bony abnormality. IMPRESSION: No active cardiopulmonary disease. Electronically Signed   By: Charlett Nose M.D.   On: 09/03/2017 08:53    Procedures Procedures (including critical care time)  Medications Ordered in ED Medications  albuterol (PROVENTIL) (2.5 MG/3ML) 0.083% nebulizer solution 5 mg (5 mg Nebulization Given 09/03/17 0800)  predniSONE (DELTASONE) tablet 60 mg (60 mg Oral Given 09/03/17 1233)  albuterol (PROVENTIL) (2.5 MG/3ML) 0.083% nebulizer solution 5 mg (5 mg Nebulization Given 09/03/17 1234)  ipratropium (ATROVENT) nebulizer solution 0.5 mg (0.5 mg Nebulization Given 09/03/17 1234)  albuterol (PROVENTIL) (2.5 MG/3ML) 0.083% nebulizer solution 5 mg (5 mg Nebulization Given 09/03/17 1320)  ipratropium (ATROVENT) nebulizer solution 0.5 mg (0.5 mg Nebulization Given 09/03/17 1320)  albuterol (PROVENTIL HFA;VENTOLIN HFA) 108 (90 Base) MCG/ACT inhaler 2 puff (2 puffs Inhalation Given 09/03/17 1443)     Initial Impression / Assessment and Plan / ED Course  I have reviewed the triage vital signs and the nursing notes.  Pertinent labs & imaging results that  were available during my care of the patient were reviewed by me and considered in my medical decision making (see chart for details).     42 y.o. male here with asthma exacerbation symptoms and URI symptoms x3 days. On exam, diffuse expiratory wheezing and scattered rhonchi throughout, no hypoxia or increased WOB. CXR neg. EKG unremarkable and nonischemic. Doubt need for labs at this time, will give steroids and nebs and reassess shortly.   1:16 PM Pt feeling moderately better and lung sounds moderately improved as well, however still with slight wheezing/rhonchi in lower fields. Will repeat nebs and reassess.   2:43 PM Pt feeling much better and lung sounds greatly improved after 2nd neb treatment. Likely asthma exacerbation due to viral  URI. Will send home with inhaler and refill rx, prednisone x4 days starting tomorrow, advised use of daily antihistamine, and other OTC remedies for symptomatic relief. F/up with [facility] in 5-7 days for recheck of symptoms and to establish medical care. Smoking cessation strongly advised.  I explained the diagnosis and have given explicit precautions to return to the ER including for any other new or worsening symptoms. The patient understands and accepts the medical plan as it's been dictated and I have answered their questions. Discharge instructions concerning home care and prescriptions have been given. The patient is STABLE and is discharged to home in good condition.    Final Clinical Impressions(s) / ED Diagnoses   Final diagnoses:  Mild intermittent asthma with exacerbation  Cough  Upper respiratory tract infection, unspecified type  Tobacco user    ED Discharge Orders        Ordered    predniSONE (DELTASONE) 20 MG tablet     09/03/17 1424    albuterol (PROVENTIL HFA;VENTOLIN HFA) 108 (90 Base) MCG/ACT inhaler  Every 4 hours PRN     09/03/17 [address] Chelsa Stout, [place], [place] 09/03/17 1444    Wynetta Fines, MD 09/05/17 1714

## 2017-09-03 NOTE — ED Notes (Signed)
Pt reports feeling much better post breathing tx.  

## 2017-09-03 NOTE — ED Triage Notes (Signed)
Pt states hx of asthma, he states he feels he has mucous and congestion in his chest. Pt also states cough and sore throat. No distress noted in triage, strong cough present.

## 2018-03-14 ENCOUNTER — Other Ambulatory Visit: Payer: Self-pay

## 2018-03-14 ENCOUNTER — Emergency Department (HOSPITAL_BASED_OUTPATIENT_CLINIC_OR_DEPARTMENT_OTHER): Payer: PRIVATE HEALTH INSURANCE

## 2018-03-14 ENCOUNTER — Encounter (HOSPITAL_BASED_OUTPATIENT_CLINIC_OR_DEPARTMENT_OTHER): Payer: Self-pay | Admitting: Emergency Medicine

## 2018-03-14 ENCOUNTER — Emergency Department (HOSPITAL_BASED_OUTPATIENT_CLINIC_OR_DEPARTMENT_OTHER)
Admission: EM | Admit: 2018-03-14 | Discharge: 2018-03-14 | Disposition: A | Discharge status: Home / Self Care | Payer: PRIVATE HEALTH INSURANCE | Attending: Emergency Medicine | Admitting: Emergency Medicine

## 2018-03-14 DIAGNOSIS — F17210 Nicotine dependence, cigarettes, uncomplicated: Secondary | ICD-10-CM | POA: Diagnosis not present

## 2018-03-14 DIAGNOSIS — S161XXA Strain of muscle, fascia and tendon at neck level, initial encounter: Secondary | ICD-10-CM | POA: Diagnosis not present

## 2018-03-14 DIAGNOSIS — S199XXA Unspecified injury of neck, initial encounter: Secondary | ICD-10-CM | POA: Diagnosis present

## 2018-03-14 DIAGNOSIS — Y999 Unspecified external cause status: Secondary | ICD-10-CM | POA: Diagnosis not present

## 2018-03-14 DIAGNOSIS — J45909 Unspecified asthma, uncomplicated: Secondary | ICD-10-CM | POA: Diagnosis not present

## 2018-03-14 DIAGNOSIS — Z79899 Other long term (current) drug therapy: Secondary | ICD-10-CM | POA: Diagnosis not present

## 2018-03-14 DIAGNOSIS — Y939 Activity, unspecified: Secondary | ICD-10-CM | POA: Insufficient documentation

## 2018-03-14 DIAGNOSIS — M5030 Other cervical disc degeneration, unspecified cervical region: Secondary | ICD-10-CM | POA: Insufficient documentation

## 2018-03-14 DIAGNOSIS — Y92410 Unspecified street and highway as the place of occurrence of the external cause: Secondary | ICD-10-CM | POA: Insufficient documentation

## 2018-03-14 MED ORDER — MELOXICAM 7.5 MG PO TABS: 15.0000 mg | ORAL_TABLET | Freq: Every day | ORAL | 0 refills | Status: DC

## 2018-03-14 MED ORDER — IBUPROFEN 800 MG PO TABS
800.0000 mg | ORAL_TABLET | Freq: Once | ORAL | Status: AC
  Administered 2018-03-14: 800 mg via ORAL
  Filled 2018-03-14: qty 1
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
## 2018-03-14 NOTE — ED Notes (Signed)
ED Provider at bedside. 

## 2019-04-01 ENCOUNTER — Other Ambulatory Visit: Payer: Self-pay

## 2019-04-01 DIAGNOSIS — Z20822 Contact with and (suspected) exposure to covid-19: Secondary | ICD-10-CM

## 2019-04-03 LAB — NOVEL CORONAVIRUS, NAA: SARS-CoV-2, NAA: NOT DETECTED

## 2019-09-11 ENCOUNTER — Emergency Department (HOSPITAL_COMMUNITY)
Admission: EM | Admit: 2019-09-11 | Discharge: 2019-09-11 | Disposition: A | Discharge status: Home / Self Care | Payer: Commercial Managed Care - PPO | Attending: Emergency Medicine | Admitting: Emergency Medicine

## 2019-09-11 ENCOUNTER — Other Ambulatory Visit: Payer: Self-pay

## 2019-09-11 ENCOUNTER — Encounter (HOSPITAL_COMMUNITY): Payer: Self-pay | Admitting: Emergency Medicine

## 2019-09-11 ENCOUNTER — Emergency Department (HOSPITAL_COMMUNITY): Payer: Commercial Managed Care - PPO

## 2019-09-11 DIAGNOSIS — J45909 Unspecified asthma, uncomplicated: Secondary | ICD-10-CM | POA: Diagnosis not present

## 2019-09-11 DIAGNOSIS — N200 Calculus of kidney: Secondary | ICD-10-CM | POA: Insufficient documentation

## 2019-09-11 DIAGNOSIS — R109 Unspecified abdominal pain: Secondary | ICD-10-CM | POA: Diagnosis present

## 2019-09-11 DIAGNOSIS — Z79899 Other long term (current) drug therapy: Secondary | ICD-10-CM | POA: Insufficient documentation

## 2019-09-11 DIAGNOSIS — F17210 Nicotine dependence, cigarettes, uncomplicated: Secondary | ICD-10-CM | POA: Insufficient documentation

## 2019-09-11 LAB — URINALYSIS, ROUTINE W REFLEX MICROSCOPIC
Bilirubin Urine: NEGATIVE
Glucose, UA: NEGATIVE mg/dL
Ketones, ur: NEGATIVE mg/dL
Leukocytes,Ua: NEGATIVE
Nitrite: NEGATIVE
Protein, ur: NEGATIVE mg/dL
RBC / HPF: >50 RBC/hpf — ABNORMAL HIGH (ref 0–5)
Specific Gravity, Urine: 1.029 (ref 1.005–1.030)
pH: 5 (ref 5.0–8.0)

## 2019-09-11 LAB — CBC
HCT: 47.9 % (ref 39.0–52.0)
Hemoglobin: 15.7 g/dL (ref 13.0–17.0)
MCH: 30.1 pg (ref 26.0–34.0)
MCHC: 32.8 g/dL (ref 30.0–36.0)
MCV: 91.9 fL (ref 80.0–100.0)
Platelets: 287 10*3/uL (ref 150–400)
RBC: 5.21 MIL/uL (ref 4.22–5.81)
RDW: 12.5 % (ref 11.5–15.5)
WBC: 9.9 10*3/uL (ref 4.0–10.5)
nRBC: 0 % (ref 0.0–0.2)

## 2019-09-11 LAB — COMPREHENSIVE METABOLIC PANEL
ALT: 19 U/L (ref 0–44)
AST: 22 U/L (ref 15–41)
Albumin: 3.8 g/dL (ref 3.5–5.0)
Alkaline Phosphatase: 45 U/L (ref 38–126)
Anion gap: 9 (ref 5–15)
BUN: 15 mg/dL (ref 6–20)
CO2: 25 mmol/L (ref 22–32)
Calcium: 9 mg/dL (ref 8.9–10.3)
Chloride: 103 mmol/L (ref 98–111)
Creatinine, Ser: 1.24 mg/dL (ref 0.61–1.24)
GFR calc Af Amer: >60 mL/min (ref >60)
GFR calc non Af Amer: >60 mL/min (ref >60)
Glucose, Bld: 157 mg/dL — ABNORMAL HIGH (ref 70–99)
Potassium: 3.5 mmol/L (ref 3.5–5.1)
Sodium: 137 mmol/L (ref 135–145)
Total Bilirubin: 0.3 mg/dL (ref 0.3–1.2)
Total Protein: 6.5 g/dL (ref 6.5–8.1)

## 2019-09-11 LAB — LIPASE, BLOOD: Lipase: 32 U/L (ref 11–51)

## 2019-09-11 MED ORDER — KETOROLAC TROMETHAMINE 30 MG/ML IJ SOLN
30.0000 mg | Freq: Once | INTRAMUSCULAR | Status: AC
  Administered 2019-09-11: 30 mg via INTRAMUSCULAR
  Filled 2019-09-11: qty 1

## 2019-09-11 MED ORDER — OXYCODONE-ACETAMINOPHEN 5-325 MG PO TABS
1.0000 | ORAL_TABLET | ORAL | Status: DC | PRN
  Administered 2019-09-11: 1 via ORAL
  Filled 2019-09-11: qty 1

## 2019-09-11 MED ORDER — TAMSULOSIN HCL 0.4 MG PO CAPS: 0.4000 mg | ORAL_CAPSULE | Freq: Every day | ORAL | 0 refills | Status: DC

## 2019-09-11 MED ORDER — IBUPROFEN 600 MG PO TABS: 600.0000 mg | ORAL_TABLET | Freq: Four times a day (QID) | ORAL | 0 refills | Status: DC | PRN

## 2019-09-11 NOTE — ED Provider Notes (Signed)
[facility] EMERGENCY DEPARTMENT Provider Note   CSN: [id number] Arrival date & time: 09/11/19  0423     History Chief Complaint  Patient presents with  . Flank Pain    Wesley Larson. is a 44 y.o. male.  The history is provided by the patient and medical records. No language interpreter was used.  Flank Pain     44 year old male with history of polysubstance abuse presenting for evaluation of flank pain.  Patient reports several days ago he developed acute onset of right flank pain that was short lasting and went away.  He was awoke this morning approximately 3 hours ago with the same pain.  Pain is sharp, radiates to his testicle, intense, felt nauseous and vomited once.  Pain is been waxing and waning and at this time improving.  No associated fever, chills, chest pain, trouble breathing, abdominal pain, dysuria, hematuria.  Denies any recent strenuous activities recent injury.  No prior history of kidney stone.  No specific treatment tried.  He did notice that his urine is a bit darker than usual.  Past Medical History:  Diagnosis Date  . Asthma     Patient Active Problem List   Diagnosis Date Noted  . Tobacco use disorder   . Polysubstance abuse (HCC)   . Altered mental status 09/11/2015  . Altered level of consciousness   . Hypothermia   . Bradycardia   . Drug abuse (HCC)     History reviewed. No pertinent surgical history.     No family history on file.  Social History   Tobacco Use  . Smoking status: Current Every Day Smoker    Packs/day: 2.00    Types: Cigarettes  . Smokeless tobacco: Never Used  Substance Use Topics  . Alcohol use: No    Comment: a lot  . Drug use: No    Home Medications Prior to Admission medications   Medication Sig Start Date End Date Taking? Authorizing Provider  albuterol (PROVENTIL HFA;VENTOLIN HFA) 108 (90 Base) MCG/ACT inhaler Inhale 1-2 puffs into the lungs every 4 (four) hours as needed for  wheezing or shortness of breath (or cough). 09/03/17   Street, Mercedes, PA-C  benzonatate (TESSALON) 100 MG capsule Take 1 capsule (100 mg total) by mouth every 8 (eight) hours. 01/19/16   Alvira Monday, MD  cyclobenzaprine (FLEXERIL) 10 MG tablet Take 1 tablet (10 mg total) by mouth 2 (two) times daily as needed for muscle spasms. 01/19/16   Alvira Monday, MD  ibuprofen (ADVIL,MOTRIN) 200 MG tablet Take 400 mg by mouth every 6 (six) hours as needed for fever or mild pain.    [provider]  meloxicam (MOBIC) 7.5 MG tablet Take 2 tablets (15 mg total) by mouth daily. 03/14/18   Little, Ambrose Finland, MD  predniSONE (DELTASONE) 20 MG tablet 3 tabs po daily x 4 days STARTING 09/04/17 09/03/17   Street, [place], [place]    Allergies    Patient has no known allergies.  Review of Systems   Review of Systems  Genitourinary: Positive for flank pain.  All other systems reviewed and are negative.   Physical Exam Updated Vital Signs BP (!) 169/115 (BP Location: Right Arm)   Pulse 80   Temp (!) 97.4 F (36.3 C) (Oral)   Resp 18   Wt 84 kg   SpO2 100%   BMI 27.35 kg/m   Physical Exam Vitals and nursing note reviewed. Exam conducted with a chaperone present.  Constitutional:  General: He is not in acute distress.    Appearance: He is well-developed.  HENT:     Head: Atraumatic.  Eyes:     Conjunctiva/sclera: Conjunctivae normal.  Cardiovascular:     Rate and Rhythm: Normal rate and regular rhythm.     Pulses: Normal pulses.     Heart sounds: Normal heart sounds.  Pulmonary:     Effort: Pulmonary effort is normal.     Breath sounds: Normal breath sounds.  Abdominal:     Palpations: Abdomen is soft.     Tenderness: There is abdominal tenderness (Tenderness to right lower quadrant on palpation no guarding or rebound tenderness.). There is no right CVA tenderness or left CVA tenderness.  Musculoskeletal:     Cervical back: Neck supple.  Skin:    Findings: No rash.    Neurological:     Mental Status: He is alert.     ED Results / Procedures / Treatments   Labs (all labs ordered are listed, but only abnormal results are displayed) Labs Reviewed  URINALYSIS, ROUTINE W REFLEX MICROSCOPIC - Abnormal; Notable for the following components:      Result Value   Hgb urine dipstick LARGE (*)    RBC / HPF >50 (*)    Bacteria, UA RARE (*)    All other components within normal limits  COMPREHENSIVE METABOLIC PANEL - Abnormal; Notable for the following components:   Glucose, Bld 157 (*)    All other components within normal limits  CBC  LIPASE, BLOOD    EKG None  Radiology CT Renal Stone Study  Result Date: 09/11/2019 CLINICAL DATA:  Flank pain with kidney stone suspected EXAM: CT ABDOMEN AND PELVIS WITHOUT CONTRAST TECHNIQUE: Multidetector CT imaging of the abdomen and pelvis was performed following the standard protocol without IV contrast. COMPARISON:  None. FINDINGS: Lower chest:  No contributory findings. Hepatobiliary: No focal liver abnormality.No evidence of biliary obstruction or stone. Pancreas: Unremarkable. Spleen: Unremarkable. Adrenals/Urinary Tract: Negative adrenals. Mild right hydroureteronephrosis due to a punctate stone at the UVJ (1-2 mm). No additional urolithiasis. Unremarkable bladder. Stomach/Bowel:  No obstruction. No appendicitis. Vascular/Lymphatic: No acute vascular abnormality. No mass or adenopathy. Reproductive:No pathologic findings. Other: No ascites or pneumoperitoneum. Musculoskeletal: No acute abnormalities. IMPRESSION: Mild right hydroureteronephrosis from a punctate UVJ calculus. Electronically Signed   By: Monte Fantasia M.D.   On: 09/11/2019 08:02    Procedures Procedures (including critical care time)  Medications Ordered in ED Medications  oxyCODONE-acetaminophen (PERCOCET/ROXICET) 5-325 MG per tablet 1 tablet (1 tablet Oral Given 09/11/19 0436)    ED Course  I have reviewed the triage vital signs and the  nursing notes.  Pertinent labs & imaging results that were available during my care of the patient were reviewed by me and considered in my medical decision making (see chart for details).    MDM Rules/Calculators/A&P                      BP (!) 169/115 (BP Location: Right Arm)   Pulse 70   Temp (!) 97.4 F (36.3 C) (Oral)   Resp 18   Wt 84 kg   SpO2 99%   BMI 27.35 kg/m   Final Clinical Impression(s) / ED Diagnoses Final diagnoses:  Kidney stone    Rx / DC Orders ED Discharge Orders         Ordered    ibuprofen (ADVIL) 600 MG tablet  Every 6 hours PRN     09/11/19 0836  tamsulosin (FLOMAX) 0.4 MG CAPS capsule  Daily     09/11/19 0836         6:53 AM Patient here with acute onset of right flank pain.  At this time pain has improved.  UA shows large amounts of hemoglobin to six suggestive of potential nephrolithiasis.  Labs otherwise reassuring.  Normal kidney function.  Will give Toradol for pain, obtain CT renal stone study.  8:37 AM CT scan significant for a right nephrolithiasis at the UVJ with mild hydronephrosis.  Finding consistent with patient's pain.  Her symptoms are improved with pain medication.  Patient stable to be discharged home.  Urology referral given as needed.   Fayrene Helper, PA-C 09/11/19 4098    Dione Booze, MD 09/11/19 [phone]

## 2019-09-11 NOTE — ED Triage Notes (Signed)
Pt in w/sharp R flank pain that woke him at 0300 this am. Pain 10/10, stabbing. C/o nausea and urge to defecate.

## 2019-09-11 NOTE — Discharge Instructions (Signed)
You have a 1-63mm kidney stone on the right side causing pain.  Take ibuprofen and flomax as prescribed for pain control.  You will likely pass this stone within the next 1-3 days.

## 2020-07-11 ENCOUNTER — Emergency Department (HOSPITAL_BASED_OUTPATIENT_CLINIC_OR_DEPARTMENT_OTHER)
Admission: EM | Admit: 2020-07-11 | Discharge: 2020-07-11 | Disposition: A | Discharge status: Home / Self Care | Payer: Commercial Managed Care - PPO | Attending: Emergency Medicine | Admitting: Emergency Medicine

## 2020-07-11 ENCOUNTER — Other Ambulatory Visit: Payer: Self-pay

## 2020-07-11 ENCOUNTER — Emergency Department (HOSPITAL_BASED_OUTPATIENT_CLINIC_OR_DEPARTMENT_OTHER): Payer: Commercial Managed Care - PPO

## 2020-07-11 ENCOUNTER — Encounter (HOSPITAL_BASED_OUTPATIENT_CLINIC_OR_DEPARTMENT_OTHER): Payer: Self-pay | Admitting: Emergency Medicine

## 2020-07-11 DIAGNOSIS — F17210 Nicotine dependence, cigarettes, uncomplicated: Secondary | ICD-10-CM | POA: Diagnosis not present

## 2020-07-11 DIAGNOSIS — J45909 Unspecified asthma, uncomplicated: Secondary | ICD-10-CM | POA: Diagnosis not present

## 2020-07-11 DIAGNOSIS — G8929 Other chronic pain: Secondary | ICD-10-CM | POA: Insufficient documentation

## 2020-07-11 DIAGNOSIS — M25512 Pain in left shoulder: Secondary | ICD-10-CM | POA: Insufficient documentation

## 2020-07-11 NOTE — ED Triage Notes (Signed)
Pt arrives pov with c/o left shoulder pain x 1 month. 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 Pt endorses swelling, pain responds to ibuprofen and ice

## 2020-07-11 NOTE — ED Provider Notes (Signed)
[facility] EMERGENCY DEPARTMENT Provider Note   CSN: [id number] Arrival date & time: 07/11/20  1413     History Chief Complaint  Patient presents with  . Shoulder Pain    Wesley Larson. is a 45 y.o. male presenting to the ED with complaint of multiple months of left shoulder pain. Pain is located to anterior/lateral aspect of the shoulder, worse with abduction. Causes radiating pain to upper arm and neck. Felt as though it was swollen yesterday. Adequate relief with ibuprofen and ice. No injuries though works in [employer], constantly lifting and doing other repetitive movements. Denies fevers, redness, numbness. No other symptoms reported.  The history is provided by the patient.       Past Medical History:  Diagnosis Date  . Asthma     Patient Active Problem List   Diagnosis Date Noted  . Tobacco use disorder   . Polysubstance abuse (HCC)   . Altered mental status 09/11/2015  . Altered level of consciousness   . Hypothermia   . Bradycardia   . Drug abuse (HCC)     History reviewed. No pertinent surgical history.     History reviewed. No pertinent family history.  Social History   Tobacco Use  . Smoking status: Current Every Day Smoker    Packs/day: 2.00    Types: Cigarettes  . Smokeless tobacco: Never Used  Substance Use Topics  . Alcohol use: No    Comment: a lot  . Drug use: No    Home Medications Prior to Admission medications   Medication Sig Start Date End Date Taking? Authorizing Provider  albuterol (PROVENTIL HFA;VENTOLIN HFA) 108 (90 Base) MCG/ACT inhaler Inhale 1-2 puffs into the lungs every 4 (four) hours as needed for wheezing or shortness of breath (or cough). 09/03/17   Street, [place], PA-C  ibuprofen (ADVIL) 600 MG tablet Take 1 tablet (600 mg total) by mouth every 6 (six) hours as needed. 09/11/19   Fayrene Helper, PA-C  Multiple Vitamin (MULTIVITAMIN WITH MINERALS) TABS tablet Take 1 tablet by mouth daily.    [provider]  sodium chloride (OCEAN) 0.65 % SOLN nasal spray Place 2 sprays into both nostrils as needed for congestion.    [provider]  tamsulosin (FLOMAX) 0.4 MG CAPS capsule Take 1 capsule (0.4 mg total) by mouth daily. 09/11/19   Fayrene Helper, PA-C    Allergies    Penicillins  Review of Systems   Review of Systems  Musculoskeletal: Positive for arthralgias.  Skin: Negative for color change.  Neurological: Negative for numbness.    Physical Exam Updated Vital Signs BP (!) 136/98 (BP Location: Left Arm)   Pulse 73   Temp 98 F (36.7 C) (Oral)   Resp 18   Ht 5\' 9"  (1.753 m)   Wt 86.2 kg   SpO2 100%   BMI 28.06 kg/m   Physical Exam Vitals and nursing note reviewed.  Constitutional:      General: He is not in acute distress.    Appearance: He is well-developed.  HENT:     Head: Normocephalic and atraumatic.  Eyes:     Conjunctiva/sclera: Conjunctivae normal.  Cardiovascular:     Rate and Rhythm: Normal rate.  Pulmonary:     Effort: Pulmonary effort is normal.  Musculoskeletal:     Comments: Left shoulder is without swelling, redness or warmth. No deformity or skin changes. TTP over acromion region. Normal ROM though abduction causes pain. Pain with empty can test and neers.  Normal sensation and strength.  Neurological:     Mental Status: He is alert.  Psychiatric:        Mood and Affect: Mood normal.        Behavior: Behavior normal.     ED Results / Procedures / Treatments   Labs (all labs ordered are listed, but only abnormal results are displayed) Labs Reviewed - No data to display  EKG None  Radiology DG Shoulder Left  Result Date: 07/11/2020 CLINICAL DATA:  Left shoulder pain EXAM: LEFT SHOULDER - 2+ VIEW COMPARISON:  None. FINDINGS: Degenerative changes in the [facility] joint with joint space narrowing and spurring. Glenohumeral joint is maintained. No acute bony abnormality. Specifically, no fracture, subluxation, or dislocation. Soft tissues  are intact. IMPRESSION: Degenerative changes in the [facility] joint.  No acute bony abnormality. Electronically Signed   By: Charlett Nose M.D.   On: 07/11/2020 15:14    Procedures Procedures   Medications Ordered in ED Medications - No data to display  ED Course  I have reviewed the triage vital signs and the nursing notes.  Pertinent labs & imaging results that were available during my care of the patient were reviewed by me and considered in my medical decision making (see chart for details).    MDM Rules/Calculators/A&P                          Patient presenting with multiple months of left shoulder pain. Exam is not concerning for septic arthritis or gout, no swelling/redness/warmth, normal ROM. Consider possible impingement vs osteoarthritis. Will provide sling for comfort, recommend continue NSAIDS, ice, rest. Topical voltaren gel. Sports medicine referral provided for follow up. Patient agreeable with plan.   Final Clinical Impression(s) / ED Diagnoses Final diagnoses:  Chronic left shoulder pain    Rx / DC Orders ED Discharge Orders    None       Robinson, [place] N, PA-C 07/11/20 1627    Tilden Fossa, MD 07/11/20 1742

## 2020-07-11 NOTE — ED Notes (Signed)
Presents with left shoulder pain, left radial pulse WNL, good capillary refill WNL, Left hand temp WNL as well. 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 Has a lot of difficulty trying to lift left hand above shoulder, pain increases with abduction of left arm.

## 2020-07-11 NOTE — Discharge Instructions (Addendum)
Please read instructions below. Apply ice to your shoulder for 20 minutes at a time. You can take 600mg  of advil (ibuprofen) every 6 hours as needed for pain. You can apply a topical voltaren gel (diclofenac) as directed for additional pain relief. This is available over-the-counter. Schedule an appointment with the sports medicine specialist for further evaluation. Return to the ER for new or concerning symptoms.

## 2020-07-11 NOTE — ED Notes (Signed)
Pt discharged to home. Discharge instructions have been discussed with patient and/or family members. Pt verbally acknowledges understanding d/c instructions, and endorses comprehension to checkout at registration before leaving.  °

## 2020-11-19 ENCOUNTER — Ambulatory Visit: Payer: Commercial Managed Care - PPO | Admitting: Family Medicine

## 2020-11-19 NOTE — Progress Notes (Deleted)
  Wesley Larson. - 45 y.o. male MRN [id number]  Date of birth: 22-Apr-1976  SUBJECTIVE:  Including CC & ROS.  No chief complaint on file.   Wesley Larson. is a 45 y.o. male that is  ***.  ***   Review of Systems See HPI   HISTORY: Past Medical, Surgical, Social, and Family History Reviewed & Updated per EMR.   Pertinent Historical Findings include:  Past Medical History:  Diagnosis Date   Asthma     No past surgical history on file.  No family history on file.  Social History   Socioeconomic History   Marital status: Single    Spouse name: Not on file   Number of children: Not on file   Years of education: Not on file   Highest education level: Not on file  Occupational History   Not on file  Tobacco Use   Smoking status: Every Day    Packs/day: 2.00    Types: Cigarettes   Smokeless tobacco: Never  Substance and Sexual Activity   Alcohol use: No    Comment: a lot   Drug use: No   Sexual activity: Not on file  Other Topics Concern   Not on file  Social History Narrative   Not on file   Social Determinants of Health   Financial Resource Strain: Not on file  Food Insecurity: Not on file  Transportation Needs: Not on file  Physical Activity: Not on file  Stress: Not on file  Social Connections: Not on file  Intimate Partner Violence: Not on file     PHYSICAL EXAM:  VS: There were no vitals taken for this visit. Physical Exam Gen: NAD, alert, cooperative with exam, well-appearing MSK:  ***      ASSESSMENT & PLAN:   No problem-specific Assessment & Plan notes found for this encounter.

## 2021-04-19 ENCOUNTER — Ambulatory Visit: Payer: Self-pay

## 2021-04-19 ENCOUNTER — Ambulatory Visit (INDEPENDENT_AMBULATORY_CARE_PROVIDER_SITE_OTHER): Payer: Commercial Managed Care - PPO | Admitting: Family Medicine

## 2021-04-19 VITALS — BP 130/94 | Ht 67.0 in | Wt 196.0 lb

## 2021-04-19 DIAGNOSIS — M7582 Other shoulder lesions, left shoulder: Secondary | ICD-10-CM

## 2021-04-19 DIAGNOSIS — M25512 Pain in left shoulder: Secondary | ICD-10-CM

## 2021-04-19 DIAGNOSIS — M778 Other enthesopathies, not elsewhere classified: Secondary | ICD-10-CM | POA: Insufficient documentation

## 2021-04-19 MED ORDER — PREDNISONE 5 MG PO TABS: ORAL_TABLET | ORAL | 0 refills | Status: DC

## 2021-04-19 MED ORDER — HYDROCODONE-ACETAMINOPHEN 5-325 MG PO TABS: 1.0000 | ORAL_TABLET | Freq: Three times a day (TID) | ORAL | 0 refills | Status: DC | PRN

## 2021-04-19 NOTE — Patient Instructions (Signed)
Nice to meet you Please try heat before exercise and ice after Please try the exercises   Please send me a message in MyChart with any questions or updates.  Please see me back in 4 weeks.   --Dr. Peniel Hass  

## 2021-04-19 NOTE — Assessment & Plan Note (Signed)
Acute on chronic in nature.  Symptoms seem to be more consistent with rotator cuff changes. -Counseled on home exercise therapy and supportive care. -Prednisone. -Norco. -Could consider physical therapy or injection.

## 2021-04-19 NOTE — Progress Notes (Signed)
°  Wesley Larson. - 45 y.o. male MRN [id number]  Date of birth: [birth year]  SUBJECTIVE:  Including CC & ROS.  No chief complaint on file.   Wesley Larson. is a 45 y.o. male that is presenting with acute on chronic left shoulder pain.  The pain is worse with certain movements.  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  No history of similar pain.  Independent review of the left shoulder x-ray from 3/20 shows no acute changes.   Review of Systems See HPI   HISTORY: Past Medical, Surgical, Social, and Family History Reviewed & Updated per EMR.   Pertinent Historical Findings include:  Past Medical History:  Diagnosis Date   Asthma     No past surgical history on file.  No family history on file.  Social History   Socioeconomic History   Marital status: Single    Spouse name: Not on file   Number of children: Not on file   Years of education: Not on file   Highest education level: Not on file  Occupational History   Not on file  Tobacco Use   Smoking status: Every Day    Packs/day: 2.00    Types: Cigarettes   Smokeless tobacco: Never  Substance and Sexual Activity   Alcohol use: No    Comment: a lot   Drug use: No   Sexual activity: Not on file  Other Topics Concern   Not on file  Social History Narrative   Not on file   Social Determinants of Health   Financial Resource Strain: Not on file  Food Insecurity: Not on file  Transportation Needs: Not on file  Physical Activity: Not on file  Stress: Not on file  Social Connections: Not on file  Intimate Partner Violence: Not on file     PHYSICAL EXAM:  VS: BP (!) 130/94    Ht 5\' 7"  (1.702 m)    Wt 196 lb (88.9 kg)    BMI 30.70 kg/m  Physical Exam Gen: NAD, alert, cooperative with exam, well-appearing   Limited ultrasound: Left shoulder:  Normal-appearing biceps tendon. There is anechoic changes at the proximal insertion to indicate an effusion. Normal-appearing subscapularis. There is  increased bogginess overlying the supraspinatus. Normal-appearing posterior glenohumeral joint. 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  Symptoms seem to be more consistent with rotator cuff changes. -Counseled on home exercise therapy and supportive care. -Prednisone. -Norco. -Could consider physical therapy or injection.

## 2021-05-17 ENCOUNTER — Encounter: Payer: Self-pay | Admitting: Family Medicine

## 2021-05-17 ENCOUNTER — Ambulatory Visit (INDEPENDENT_AMBULATORY_CARE_PROVIDER_SITE_OTHER): Payer: Commercial Managed Care - PPO | Admitting: Family Medicine

## 2021-05-17 ENCOUNTER — Ambulatory Visit: Payer: Self-pay

## 2021-05-17 VITALS — BP 108/80 | Ht 67.0 in | Wt 196.0 lb

## 2021-05-17 DIAGNOSIS — M5412 Radiculopathy, cervical region: Secondary | ICD-10-CM | POA: Diagnosis not present

## 2021-05-17 DIAGNOSIS — M7582 Other shoulder lesions, left shoulder: Secondary | ICD-10-CM

## 2021-05-17 DIAGNOSIS — M778 Other enthesopathies, not elsewhere classified: Secondary | ICD-10-CM | POA: Diagnosis not present

## 2021-05-17 MED ORDER — TRIAMCINOLONE ACETONIDE 40 MG/ML IJ SUSP
40.0000 mg | Freq: Once | INTRAMUSCULAR | Status: AC
  Administered 2021-05-17: 17:00:00 40 mg via INTRA_ARTICULAR

## 2021-05-17 NOTE — Patient Instructions (Signed)
Good to see you Please try heat before exercise and ice after  Please try the exercises   Please send me a message in MyChart with any questions or updates.  Please see me back in 4 weeks.   --Dr. Makinzie Considine  

## 2021-05-17 NOTE — Progress Notes (Addendum)
  Wesley Larson. - 46 y.o. male MRN [id number]  Date of birth: 12-Mar-1976  SUBJECTIVE:  Including CC & ROS.  No chief complaint on file.   Wesley Larson. is a 46 y.o. male that is presenting with worsening of his left shoulder pain.  No relief with the prednisone.  Acutely worsening.  Pain with lying on the affected side.    Review of Systems See HPI   HISTORY: Past Medical, Surgical, Social, and Family History Reviewed & Updated per EMR.   Pertinent Historical Findings include:  Past Medical History:  Diagnosis Date   Asthma     History reviewed. No pertinent surgical history.   PHYSICAL EXAM:  VS: BP 108/80 (BP Location: Left Arm, Patient Position: Sitting)   Ht 5\' 7"  (1.702 m)   Wt 196 lb (88.9 kg)   BMI 30.70 kg/m  Physical Exam Gen: NAD, alert, cooperative with exam, well-appearing MSK:  Neurovascularly intact     Aspiration/Injection Procedure Note Rosean Ekblad. 10/02/1975  Procedure: Injection Indications: Left shoulder pain  Procedure Details Consent: Risks of procedure as well as the alternatives and risks of each were explained to the (patient/caregiver).  Consent for procedure obtained. Time Out: Verified patient identification, verified procedure, site/side was marked, verified correct patient position, special equipment/implants available, medications/allergies/relevent history reviewed, required imaging and test results available.  Performed.  The area was cleaned with iodine and alcohol swabs.    The left glenohumeral joint was injected using 3 cc of 1% lidocaine on a 22-gauge 3-1/2 inch needle.  The syringe was switched to mixture containing 1 cc's of 40 mg Kenalog and 4 cc's of 0.25% bupivacaine was injected.  Ultrasound was used. Images were obtained in short views showing the injection.     A sterile dressing was applied.  Patient did tolerate procedure well.     ASSESSMENT & PLAN:   Capsulitis of shoulder, left Acutely  worsening.  Symptoms seem more consistent with the joint at this time. -Counseled on home exercise therapy and supportive care. -Injection today. -Could consider physical therapy or further imaging.  Cervical radiculopathy Acute on chronic in nature.  Does seem to have component of radicular pain on the left side.  Has not responded to therapy.  Trying to differentiate between a nerve origin and the shoulder being the source. -Counseled on home exercise therapy and supportive care. -Could consider further imaging.

## 2021-05-17 NOTE — Assessment & Plan Note (Signed)
Acutely worsening.  Symptoms seem more consistent with the joint at this time. -Counseled on home exercise therapy and supportive care. -Injection today. -Could consider physical therapy or further imaging.

## 2021-05-21 ENCOUNTER — Encounter: Payer: Self-pay | Admitting: Family Medicine

## 2021-05-21 ENCOUNTER — Other Ambulatory Visit: Payer: Self-pay | Admitting: Family Medicine

## 2021-05-23 ENCOUNTER — Telehealth: Payer: Self-pay | Admitting: Family Medicine

## 2021-05-23 MED ORDER — HYDROCODONE-ACETAMINOPHEN 5-325 MG PO TABS: 1.0000 | ORAL_TABLET | Freq: Three times a day (TID) | ORAL | 0 refills | Status: DC | PRN

## 2021-05-23 NOTE — Telephone Encounter (Signed)
Provided norco at different location.   Myra Rude, MD [facility] 05/23/2021, 4:37 PM

## 2021-05-23 NOTE — Telephone Encounter (Signed)
Pt's wife called states [facility] unable to fill Hydrocodone Rx they do not have any in stock HYDROcodone-acetaminophen (NORCO/VICODIN) 5-325 MG tablet [id number]    Order Details Dose: 1 tablet Route: Oral Frequency: Every 8 hours PRN  Dispense Quantity: 15 tablet Refills: 0        Sig: Take 1 tablet by mouth every 8 (eight) hours as needed   --Pt now request Rx order to be sent to :  [place], [place] AT [place] Phone:  [phone]  Fax:  [phone]    --Forwarding request to provider.  --glh

## 2021-06-01 ENCOUNTER — Ambulatory Visit: Payer: Commercial Managed Care - PPO | Admitting: Family Medicine

## 2021-06-20 ENCOUNTER — Ambulatory Visit (INDEPENDENT_AMBULATORY_CARE_PROVIDER_SITE_OTHER): Payer: Commercial Managed Care - PPO | Admitting: Family Medicine

## 2021-06-20 ENCOUNTER — Encounter: Payer: Self-pay | Admitting: Family Medicine

## 2021-06-20 DIAGNOSIS — M778 Other enthesopathies, not elsewhere classified: Secondary | ICD-10-CM | POA: Diagnosis not present

## 2021-06-20 NOTE — Assessment & Plan Note (Signed)
Improvement with the intra-articular injection. Only minor pain intermittently.  - counseled on home exercise therapy and supportive care - could consider further imaging.

## 2021-06-20 NOTE — Progress Notes (Signed)
°  Wesley Larson. - 46 y.o. male MRN [id number]  Date of birth: 07/08/1975  SUBJECTIVE:  Including CC & ROS.  No chief complaint on file.   Wesley Pasquarelli. is a 46 y.o. male that is  following up for his shoulder pain. He has been doing well since the intra-articular injection. Able to do work.   Review of Systems See HPI   HISTORY: Past Medical, Surgical, Social, and Family History Reviewed & Updated per EMR.   Pertinent Historical Findings include:  Past Medical History:  Diagnosis Date   Asthma     History reviewed. No pertinent surgical history.   PHYSICAL EXAM:  VS: BP 108/86 (BP Location: Left Arm, Patient Position: Sitting)    Ht 5\' 7"  (1.702 m)    Wt 194 lb (88 kg)    BMI 30.38 kg/m  Physical Exam Gen: NAD, alert, cooperative with exam, well-appearing MSK:  Neurovascularly intact       ASSESSMENT & PLAN:   Capsulitis of shoulder, left Improvement with the intra-articular injection. Only minor pain intermittently.  - counseled on home exercise therapy and supportive care - could consider further imaging.

## 2021-09-23 ENCOUNTER — Emergency Department (HOSPITAL_BASED_OUTPATIENT_CLINIC_OR_DEPARTMENT_OTHER): Payer: Commercial Managed Care - PPO

## 2021-09-23 ENCOUNTER — Other Ambulatory Visit: Payer: Self-pay

## 2021-09-23 ENCOUNTER — Encounter (HOSPITAL_BASED_OUTPATIENT_CLINIC_OR_DEPARTMENT_OTHER): Payer: Self-pay

## 2021-09-23 ENCOUNTER — Emergency Department (HOSPITAL_BASED_OUTPATIENT_CLINIC_OR_DEPARTMENT_OTHER)
Admission: EM | Admit: 2021-09-23 | Discharge: 2021-09-23 | Disposition: A | Discharge status: Home / Self Care | Payer: Commercial Managed Care - PPO | Attending: Emergency Medicine | Admitting: Emergency Medicine

## 2021-09-23 DIAGNOSIS — F17200 Nicotine dependence, unspecified, uncomplicated: Secondary | ICD-10-CM | POA: Insufficient documentation

## 2021-09-23 DIAGNOSIS — J45909 Unspecified asthma, uncomplicated: Secondary | ICD-10-CM | POA: Insufficient documentation

## 2021-09-23 DIAGNOSIS — L03114 Cellulitis of left upper limb: Secondary | ICD-10-CM | POA: Insufficient documentation

## 2021-09-23 DIAGNOSIS — Z7952 Long term (current) use of systemic steroids: Secondary | ICD-10-CM | POA: Diagnosis not present

## 2021-09-23 DIAGNOSIS — M25512 Pain in left shoulder: Secondary | ICD-10-CM | POA: Diagnosis present

## 2021-09-23 DIAGNOSIS — L03818 Cellulitis of other sites: Secondary | ICD-10-CM

## 2021-09-23 MED ORDER — CEPHALEXIN 500 MG PO CAPS: 500.0000 mg | ORAL_CAPSULE | Freq: Four times a day (QID) | ORAL | 0 refills | Status: AC

## 2021-09-23 NOTE — Discharge Instructions (Addendum)
Please start taking antibiotics as prescribed. If the swelling starts to worsen or does not improve on antibiotics, please return to the ED. Please follow up with Sports Medicine regarding your chronic left shoulder pain.

## 2021-09-23 NOTE — ED Provider Notes (Signed)
[facility] EMERGENCY DEPARTMENT Provider Note   CSN: [id number] Arrival date & time: 09/23/21  4287     History PMH: asthma, tobacco use disorder Chief Complaint  Patient presents with   Shoulder Pain    Wesley Larson. is a 46 y.o. male. Patient presents to the ED with complaint of left shoulder pain.  He says he was at work about 2 to 3 days ago he felt something possibly bite the posterior side of his left shoulder.  He said that he did not think much of it but the next morning he woke up and he felt like he had some swelling of his left shoulder.  He said today he noticed redness and more swelling,.  He has not had any abnormal drainage from the area.  He denies any fevers, chills, worsening in his left shoulder function, nausea, vomiting, diarrhea, abdominal pain, chest pain, or shortness of breath.   Shoulder Pain     Home Medications Prior to Admission medications   Medication Sig Start Date End Date Taking? Authorizing Provider  cephALEXin (KEFLEX) 500 MG capsule Take 1 capsule (500 mg total) by mouth 4 (four) times daily for 7 days. 09/23/21 09/30/21 Yes Enya Bureau, Finis Bud, PA-C  albuterol (PROVENTIL HFA;VENTOLIN HFA) 108 (90 Base) MCG/ACT inhaler Inhale 1-2 puffs into the lungs every 4 (four) hours as needed for wheezing or shortness of breath (or cough). 09/03/17   Street, [place], PA-C  HYDROcodone-acetaminophen (NORCO/VICODIN) 5-325 MG tablet Take 1 tablet by mouth every 8 (eight) hours as needed. 05/23/21   Myra Rude, MD  ibuprofen (ADVIL) 600 MG tablet Take 1 tablet (600 mg total) by mouth every 6 (six) hours as needed. 09/11/19   Fayrene Helper, PA-C  Multiple Vitamin (MULTIVITAMIN WITH MINERALS) TABS tablet Take 1 tablet by mouth daily.    [provider]  predniSONE (DELTASONE) 5 MG tablet Take 6 pills for first day, 5 pills second day, 4 pills third day, 3 pills fourth day, 2 pills the fifth day, and 1 pill sixth day. 04/19/21   Myra Rude, MD      Allergies    Penicillins    Review of Systems   Review of Systems  Skin:  Positive for color change.  All other systems reviewed and are negative.  Physical Exam Updated Vital Signs BP 132/75 (BP Location: Right Arm)   Pulse 64   Temp 98.3 F (36.8 C) (Oral)   Resp 16   Ht 5\' 9"  (1.753 m)   Wt 90.7 kg   SpO2 97%   BMI 29.53 kg/m  Physical Exam Vitals and nursing note reviewed.  Constitutional:      General: He is not in acute distress.    Appearance: Normal appearance. He is well-developed. He is not ill-appearing, toxic-appearing or diaphoretic.  HENT:     Head: Normocephalic and atraumatic.     Nose: No nasal deformity.     Mouth/Throat:     Lips: Pink. No lesions.  Eyes:     General: Gaze aligned appropriately. No scleral icterus.       Right eye: No discharge.        Left eye: No discharge.     Conjunctiva/sclera: Conjunctivae normal.     Right eye: Right conjunctiva is not injected. No exudate or hemorrhage.    Left eye: Left conjunctiva is not injected. No exudate or hemorrhage. Pulmonary:     Effort: Pulmonary effort is normal. No respiratory distress.  Musculoskeletal:  Comments: full range of motion of his neck.  No cervical midline tenderness to palpation.  Full range of motion of left shoulder with no palpable tenderness to palpation.  Skin:    General: Skin is warm and dry.     Findings: Erythema present. No abscess.          Comments: There is a approximately 6 x 5 cm erythematous area with mild soft tissue swelling.  It is not indurated or hard.  There is no underlying abscess or fluctuant area that I can palpate.  There is no abnormal drainage.  It is warm to the touch and a little bit tender.  See photo below  Neurological:     Mental Status: He is alert and oriented to person, place, and time.  Psychiatric:        Mood and Affect: Mood normal.        Speech: Speech normal.        Behavior: Behavior normal. Behavior is cooperative.      ED Results / Procedures / Treatments   Labs (all labs ordered are listed, but only abnormal results are displayed) Labs Reviewed - No data to display  EKG None  Radiology DG Shoulder Left  Result Date: 09/23/2021 CLINICAL DATA:  Pain swelling, and redness on superior left shoulder since yesterday. EXAM: LEFT SHOULDER - 2+ VIEW COMPARISON:  Left shoulder radiographs 07/11/2020 FINDINGS: Minimal glenohumeral joint space narrowing. Mild inferior glenoid degenerative osteophytosis. Minimal acromioclavicular joint space narrowing and peripheral osteophytosis. No acute fracture or dislocation. The visualized portion of the left lung is unremarkable. 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 Electronically Signed   By: Neita Garnet M.D.   On: 09/23/2021 09:14    Procedures Procedures    Medications Ordered in ED Medications - No data to display  ED Course/ Medical Decision Making/ A&P                           Medical Decision Making Amount and/or Complexity of Data Reviewed Radiology: ordered.   Patient is here with erythema to his posterior left shoulder.  He thinks he may have been bitten by something.  He appears stable with normal vitals.  He has had no systemic symptoms.  There is a moderate size area of erythema and soft tissue swelling.  This seems consistent with superficial skin infection.  No abscess to drain.  This does not seem consistent with a musculoskeletal cause or is related to his chronic left shoulder pain. X-ray of the left shoulder reveals chronic degenerative changes of the left shoulder without any acute abnormalities Recommend treatment with Keflex and return precautions provided.   Final Clinical Impression(s) / ED Diagnoses Final diagnoses:  Cellulitis of other specified site - left posterior shoulder    Rx / DC Orders ED Discharge Orders          Ordered    cephALEXin (KEFLEX) 500 MG capsule  4 times daily         09/23/21 0926              Claudie Leach, PA-C 09/23/21 0928    Virgina Norfolk, DO 09/23/21 1157

## 2021-09-23 NOTE — ED Triage Notes (Signed)
Red swollen area to left trapezius muscle, states pain when turning head.

## 2021-09-28 ENCOUNTER — Other Ambulatory Visit: Payer: Self-pay | Admitting: Family Medicine

## 2021-09-28 ENCOUNTER — Encounter: Payer: Self-pay | Admitting: Family Medicine

## 2021-09-28 MED ORDER — HYDROCODONE-ACETAMINOPHEN 5-325 MG PO TABS: 1.0000 | ORAL_TABLET | Freq: Three times a day (TID) | ORAL | 0 refills | Status: DC | PRN

## 2021-09-28 NOTE — Progress Notes (Signed)
Provided norco for severe pain as needed.   Rosemarie Ax, MD [facility] 09/28/2021, 4:52 PM

## 2021-10-03 ENCOUNTER — Ambulatory Visit: Payer: Commercial Managed Care - PPO | Admitting: Family Medicine

## 2021-10-05 ENCOUNTER — Ambulatory Visit (INDEPENDENT_AMBULATORY_CARE_PROVIDER_SITE_OTHER): Payer: Commercial Managed Care - PPO | Admitting: Family Medicine

## 2021-10-05 ENCOUNTER — Encounter: Payer: Self-pay | Admitting: Family Medicine

## 2021-10-05 VITALS — BP 130/96 | Ht 69.0 in | Wt 190.0 lb

## 2021-10-05 DIAGNOSIS — M5412 Radiculopathy, cervical region: Secondary | ICD-10-CM | POA: Diagnosis not present

## 2021-10-05 MED ORDER — PREDNISONE 5 MG PO TABS: ORAL_TABLET | ORAL | 0 refills | Status: DC

## 2021-10-05 MED ORDER — GABAPENTIN 300 MG PO CAPS: 300.0000 mg | ORAL_CAPSULE | Freq: Three times a day (TID) | ORAL | 1 refills | Status: DC

## 2021-10-05 MED ORDER — HYDROCODONE-ACETAMINOPHEN 5-325 MG PO TABS: 1.0000 | ORAL_TABLET | Freq: Three times a day (TID) | ORAL | 0 refills | Status: DC | PRN

## 2021-10-05 NOTE — Progress Notes (Signed)
  Wesley Larson. - 46 y.o. male MRN [id number]  Date of birth: [birth year]  SUBJECTIVE:  Including CC & ROS.  No chief complaint on file.   Wesley Larson. is a 47 y.o. male that is presenting with acute on chronic left-sided neck and shoulder pain.  The pain has been ongoing for over a year.  We have tried injection and home therapy with limited improvement..   Review of Systems See HPI   HISTORY: Past Medical, Surgical, Social, and Family History Reviewed & Updated per EMR.   Pertinent Historical Findings include:  Past Medical History:  Diagnosis Date   Asthma     History reviewed. No pertinent surgical history.   PHYSICAL EXAM:  VS: BP (!) 130/96 (BP Location: Left Arm, Patient Position: Sitting)   Ht 5\' 9"  (1.753 m)   Wt 190 lb (86.2 kg)   BMI 28.06 kg/m  Physical Exam Gen: NAD, alert, cooperative with exam, well-appearing MSK:  Neck: Normal extension and flexion. Limited lateral rotation. Positive Spurling's test. Neurovascularly intact       ASSESSMENT & PLAN:   Cervical radiculopathy Acute on chronic in nature.  Symptoms seem more consistent with radicular pain at this time.  He has been under greater than 6 weeks of physician directed home exercise therapy. -Counseled on home exercise therapy and supportive care. -Prednisone. -Norco. -Gabapentin. -X-ray. -MRI of the cervical spine evaluate for nerve impingement and consideration of epidural.

## 2021-10-05 NOTE — Assessment & Plan Note (Signed)
Acute on chronic in nature.  Does seem to have component of radicular pain on the left side.  Has not responded to therapy.  Trying to differentiate between a nerve origin and the shoulder being the source. -Counseled on home exercise therapy and supportive care. -Could consider further imaging.

## 2021-10-05 NOTE — Assessment & Plan Note (Signed)
Acute on chronic in nature.  Symptoms seem more consistent with radicular pain at this time.  He has been under greater than 6 weeks of physician directed home exercise therapy. -Counseled on home exercise therapy and supportive care. -Prednisone. -Norco. -Gabapentin. -X-ray. -MRI of the cervical spine evaluate for nerve impingement and consideration of epidural.

## 2021-10-05 NOTE — Patient Instructions (Signed)
Good to see you  Please try heat on the neck. Please try the exercises. Please start with the gabapentin at night.  You can increase to 2-3 times daily as you tolerate. Please use the pain medicine as needed I will call with the x-ray results.   We will get the MRI at [facility] imaging. Please send me a message in MyChart with any questions or updates.  We will set up a virtual visit once the MRI is resulted.  --Dr. Jordan Likes

## 2021-10-07 ENCOUNTER — Ambulatory Visit (HOSPITAL_BASED_OUTPATIENT_CLINIC_OR_DEPARTMENT_OTHER)
Admission: RE | Admit: 2021-10-07 | Discharge: 2021-10-07 | Disposition: A | Discharge status: Home / Self Care | Payer: Commercial Managed Care - PPO | Source: Ambulatory Visit | Attending: Family Medicine | Admitting: Family Medicine

## 2021-10-07 DIAGNOSIS — M5412 Radiculopathy, cervical region: Secondary | ICD-10-CM | POA: Diagnosis present

## 2021-10-08 ENCOUNTER — Encounter: Payer: Self-pay | Admitting: Family Medicine

## 2021-10-10 ENCOUNTER — Telehealth: Payer: Self-pay | Admitting: Family Medicine

## 2021-10-10 ENCOUNTER — Other Ambulatory Visit: Payer: Self-pay | Admitting: Family Medicine

## 2021-10-10 DIAGNOSIS — M5412 Radiculopathy, cervical region: Secondary | ICD-10-CM

## 2021-10-10 MED ORDER — HYDROCODONE-ACETAMINOPHEN 5-325 MG PO TABS: 1.0000 | ORAL_TABLET | Freq: Three times a day (TID) | ORAL | 0 refills | Status: DC | PRN

## 2021-10-10 NOTE — Telephone Encounter (Signed)
Left VM for patient. If he calls back please have him speak with a nurse/CMA and inform that his xray is showing degenerative changes.   If any questions then please take the best time and phone number to call and I will try to call him back.   Myra Rude, MD [facility] 10/10/2021, 9:47 AM

## 2021-10-10 NOTE — Telephone Encounter (Signed)
Patient called back and was given results stated understanding. Patient said if there was anymore explanation on the Xray he can be reached at [phone]. Patient also stated that he was in a lot of pain with numbness and tingling down his arm the last couple days, informed to continue with the MRI that's already scheduled and we will call back to schedule virtual once the results are in. Patient stated understanding

## 2021-10-12 ENCOUNTER — Ambulatory Visit
Admission: RE | Admit: 2021-10-12 | Discharge: 2021-10-12 | Disposition: A | Discharge status: Home / Self Care | Payer: Commercial Managed Care - PPO | Source: Ambulatory Visit | Attending: Family Medicine | Admitting: Family Medicine

## 2021-10-12 DIAGNOSIS — M5412 Radiculopathy, cervical region: Secondary | ICD-10-CM

## 2021-10-14 ENCOUNTER — Encounter: Payer: Self-pay | Admitting: Family Medicine

## 2021-10-14 ENCOUNTER — Telehealth (INDEPENDENT_AMBULATORY_CARE_PROVIDER_SITE_OTHER): Payer: Commercial Managed Care - PPO | Admitting: Family Medicine

## 2021-10-14 DIAGNOSIS — M5412 Radiculopathy, cervical region: Secondary | ICD-10-CM

## 2021-10-14 MED ORDER — HYDROCODONE-ACETAMINOPHEN 5-325 MG PO TABS: 1.0000 | ORAL_TABLET | Freq: Three times a day (TID) | ORAL | 0 refills | Status: DC | PRN

## 2021-10-19 ENCOUNTER — Ambulatory Visit
Admission: RE | Admit: 2021-10-19 | Discharge: 2021-10-19 | Disposition: A | Discharge status: Home / Self Care | Payer: Commercial Managed Care - PPO | Source: Ambulatory Visit | Attending: Family Medicine | Admitting: Family Medicine

## 2021-10-19 ENCOUNTER — Encounter: Payer: Self-pay | Admitting: Family Medicine

## 2021-10-19 ENCOUNTER — Other Ambulatory Visit: Payer: Self-pay | Admitting: Family Medicine

## 2021-10-19 DIAGNOSIS — M5412 Radiculopathy, cervical region: Secondary | ICD-10-CM

## 2021-10-19 MED ORDER — HYDROCODONE-ACETAMINOPHEN 5-325 MG PO TABS
1.0000 | ORAL_TABLET | Freq: Three times a day (TID) | ORAL | 0 refills | Status: DC | PRN
Start: 2021-10-19 — End: 2021-10-23

## 2021-10-19 MED ORDER — IOPAMIDOL (ISOVUE-M 300) INJECTION 61%
1.0000 mL | Freq: Once | INTRAMUSCULAR | Status: AC
  Administered 2021-10-19: 1 mL via EPIDURAL

## 2021-10-19 MED ORDER — TRIAMCINOLONE ACETONIDE 40 MG/ML IJ SUSP (RADIOLOGY)
60.0000 mg | Freq: Once | INTRAMUSCULAR | Status: AC
  Administered 2021-10-19: 60 mg via EPIDURAL

## 2021-10-19 NOTE — Discharge Instructions (Signed)

## 2021-10-23 ENCOUNTER — Other Ambulatory Visit: Payer: Self-pay | Admitting: Family Medicine

## 2021-10-23 DIAGNOSIS — M5412 Radiculopathy, cervical region: Secondary | ICD-10-CM

## 2021-10-24 ENCOUNTER — Encounter: Payer: Self-pay | Admitting: Family Medicine

## 2021-10-26 ENCOUNTER — Encounter: Payer: Self-pay | Admitting: Family Medicine

## 2021-10-26 MED ORDER — HYDROCODONE-ACETAMINOPHEN 5-325 MG PO TABS: 1.0000 | ORAL_TABLET | Freq: Three times a day (TID) | ORAL | 0 refills | Status: DC | PRN

## 2021-10-30 ENCOUNTER — Other Ambulatory Visit: Payer: Self-pay | Admitting: Family Medicine

## 2021-10-30 DIAGNOSIS — M5412 Radiculopathy, cervical region: Secondary | ICD-10-CM

## 2021-10-31 ENCOUNTER — Ambulatory Visit: Payer: Commercial Managed Care - PPO | Admitting: Family Medicine

## 2021-10-31 MED ORDER — HYDROCODONE-ACETAMINOPHEN 5-325 MG PO TABS: 1.0000 | ORAL_TABLET | Freq: Three times a day (TID) | ORAL | 0 refills | Status: DC | PRN

## 2021-11-01 ENCOUNTER — Other Ambulatory Visit: Payer: Self-pay | Admitting: Family Medicine

## 2021-11-01 DIAGNOSIS — M5412 Radiculopathy, cervical region: Secondary | ICD-10-CM

## 2021-11-03 ENCOUNTER — Encounter: Payer: Self-pay | Admitting: Family Medicine

## 2021-11-03 ENCOUNTER — Other Ambulatory Visit: Payer: Self-pay | Admitting: Family Medicine

## 2021-11-03 DIAGNOSIS — M5412 Radiculopathy, cervical region: Secondary | ICD-10-CM

## 2021-11-04 ENCOUNTER — Other Ambulatory Visit: Payer: Self-pay | Admitting: Family Medicine

## 2021-11-04 DIAGNOSIS — M5412 Radiculopathy, cervical region: Secondary | ICD-10-CM

## 2021-11-04 MED ORDER — HYDROCODONE-ACETAMINOPHEN 5-325 MG PO TABS
1.0000 | ORAL_TABLET | Freq: Three times a day (TID) | ORAL | 0 refills | Status: DC | PRN
Start: 2021-11-04 — End: 2021-11-08

## 2021-11-04 NOTE — Progress Notes (Signed)
Refilled norco. Epidural pending   Myra Rude, MD [facility] 11/04/2021, 7:56 AM

## 2021-11-08 ENCOUNTER — Encounter: Payer: Self-pay | Admitting: Family Medicine

## 2021-11-08 ENCOUNTER — Other Ambulatory Visit: Payer: Self-pay | Admitting: Family Medicine

## 2021-11-08 DIAGNOSIS — M5412 Radiculopathy, cervical region: Secondary | ICD-10-CM

## 2021-11-08 MED ORDER — HYDROCODONE-ACETAMINOPHEN 5-325 MG PO TABS: 1.0000 | ORAL_TABLET | Freq: Three times a day (TID) | ORAL | 0 refills | Status: DC | PRN

## 2021-11-08 NOTE — Progress Notes (Unsigned)
rr

## 2021-11-11 ENCOUNTER — Ambulatory Visit
Admission: RE | Admit: 2021-11-11 | Discharge: 2021-11-11 | Disposition: A | Discharge status: Home / Self Care | Payer: Commercial Managed Care - PPO | Source: Ambulatory Visit | Attending: Family Medicine | Admitting: Family Medicine

## 2021-11-11 DIAGNOSIS — M5412 Radiculopathy, cervical region: Secondary | ICD-10-CM

## 2021-11-11 MED ORDER — IOPAMIDOL (ISOVUE-M 200) INJECTION 41%
1.0000 mL | Freq: Once | INTRAMUSCULAR | Status: AC
  Administered 2021-11-11: 1 mL via EPIDURAL

## 2021-11-11 MED ORDER — TRIAMCINOLONE ACETONIDE 40 MG/ML IJ SUSP (RADIOLOGY)
60.0000 mg | Freq: Once | INTRAMUSCULAR | Status: AC
  Administered 2021-11-11: 60 mg via EPIDURAL

## 2021-11-11 NOTE — Discharge Instructions (Signed)

## 2021-11-14 ENCOUNTER — Other Ambulatory Visit: Payer: Self-pay | Admitting: Family Medicine

## 2021-11-14 ENCOUNTER — Encounter: Payer: Self-pay | Admitting: Family Medicine

## 2021-11-14 ENCOUNTER — Other Ambulatory Visit: Payer: Self-pay | Admitting: *Deleted

## 2021-11-14 DIAGNOSIS — M5412 Radiculopathy, cervical region: Secondary | ICD-10-CM

## 2021-11-14 MED ORDER — HYDROCODONE-ACETAMINOPHEN 5-325 MG PO TABS: 1.0000 | ORAL_TABLET | Freq: Four times a day (QID) | ORAL | 0 refills | Status: DC | PRN

## 2021-11-17 ENCOUNTER — Encounter: Payer: Self-pay | Admitting: Family Medicine

## 2021-11-17 ENCOUNTER — Other Ambulatory Visit: Payer: Self-pay | Admitting: Family Medicine

## 2021-11-17 DIAGNOSIS — M5412 Radiculopathy, cervical region: Secondary | ICD-10-CM

## 2021-11-21 ENCOUNTER — Encounter: Payer: Self-pay | Admitting: Family Medicine

## 2021-11-22 MED ORDER — HYDROCODONE-ACETAMINOPHEN 5-325 MG PO TABS
1.0000 | ORAL_TABLET | Freq: Three times a day (TID) | ORAL | 0 refills | Status: DC | PRN
Start: 2021-11-22 — End: 2021-11-27

## 2021-11-24 ENCOUNTER — Telehealth: Payer: Self-pay | Admitting: *Deleted

## 2021-11-24 NOTE — Telephone Encounter (Signed)
Referral faxed again to [phone].

## 2021-11-24 NOTE — Telephone Encounter (Signed)
-----   Message from Carl Best sent at 11/24/2021  8:57 AM EDT ----- Regarding: No Referral found @ [facility] for Patient Wesley Larson cld from [facility] stating no referral to them for this patient-- pls [phone]  to discuss & or fax referral again.  Thx, glh

## 2021-11-27 ENCOUNTER — Other Ambulatory Visit: Payer: Self-pay | Admitting: Family Medicine

## 2021-11-27 DIAGNOSIS — M5412 Radiculopathy, cervical region: Secondary | ICD-10-CM

## 2021-11-28 ENCOUNTER — Encounter: Payer: Self-pay | Admitting: Family Medicine

## 2021-11-28 MED ORDER — HYDROCODONE-ACETAMINOPHEN 5-325 MG PO TABS: 1.0000 | ORAL_TABLET | Freq: Three times a day (TID) | ORAL | 0 refills | Status: DC | PRN

## 2021-12-06 ENCOUNTER — Other Ambulatory Visit: Payer: Self-pay | Admitting: Family Medicine

## 2021-12-06 ENCOUNTER — Encounter: Payer: Self-pay | Admitting: Family Medicine

## 2021-12-06 DIAGNOSIS — M5412 Radiculopathy, cervical region: Secondary | ICD-10-CM

## 2022-02-08 ENCOUNTER — Other Ambulatory Visit: Payer: Self-pay | Admitting: Neurological Surgery

## 2022-02-08 DIAGNOSIS — M19012 Primary osteoarthritis, left shoulder: Secondary | ICD-10-CM

## 2022-02-19 ENCOUNTER — Ambulatory Visit
Admission: RE | Admit: 2022-02-19 | Discharge: 2022-02-19 | Disposition: A | Discharge status: Home / Self Care | Payer: Commercial Managed Care - PPO | Source: Ambulatory Visit | Attending: Neurological Surgery | Admitting: Neurological Surgery

## 2022-02-19 DIAGNOSIS — M19012 Primary osteoarthritis, left shoulder: Secondary | ICD-10-CM

## 2022-03-10 ENCOUNTER — Institutional Professional Consult (permissible substitution): Payer: Commercial Managed Care - PPO | Admitting: Pulmonary Disease

## 2022-03-14 ENCOUNTER — Encounter: Payer: Self-pay | Admitting: Orthopaedic Surgery

## 2022-03-14 ENCOUNTER — Ambulatory Visit (INDEPENDENT_AMBULATORY_CARE_PROVIDER_SITE_OTHER): Payer: Commercial Managed Care - PPO | Admitting: Orthopaedic Surgery

## 2022-03-14 DIAGNOSIS — M75122 Complete rotator cuff tear or rupture of left shoulder, not specified as traumatic: Secondary | ICD-10-CM | POA: Diagnosis not present

## 2022-03-14 DIAGNOSIS — M7542 Impingement syndrome of left shoulder: Secondary | ICD-10-CM | POA: Insufficient documentation

## 2022-03-14 MED ORDER — TRAMADOL HCL 50 MG PO TABS: 50.0000 mg | ORAL_TABLET | Freq: Every day | ORAL | 0 refills | Status: DC | PRN

## 2022-03-14 NOTE — Progress Notes (Signed)
Office Visit Note   Patient: Wesley Larson.           Date of Birth: 25-Mar-1976           MRN: [id number] Visit Date: 03/14/2022              Requested by: No referring provider defined for this encounter. PCP: Pcp, No   Assessment & Plan: Visit Diagnoses:  1. Impingement syndrome of left shoulder   2. Nontraumatic complete tear of left rotator cuff     Plan: I did review the MRI of the shoulder which shows high-grade partial-thickness tear of the supraspinatus with a small area of full-thickness tear anteriorly.  The remaining portions of the rotator cuff are unremarkable.  He does have unfavorable acromial anatomy predisposing to impingement.  There is subacromial bursitis.  There is increased signal at the superior biceps labral junction which may represent a tear.  Based on these findings and the lack of relief from conservative treatments such as physical therapy and cortisone injections I have recommended arthroscopic rotator cuff debridement and repair as indicated with subacromial decompression and extensive debridement.  We will evaluate the superior labrum and possibly perform biceps tenodesis if indicated.  Tramadol prescribed today.  Patient does have past history of polysubstance abuse so we will need to be careful about postoperative pain medications  Follow-Up Instructions: No follow-ups on file.   Orders:  No orders of the defined types were placed in this encounter.  Meds ordered this encounter  Medications   traMADol (ULTRAM) 50 MG tablet    Sig: Take 1-2 tablets (50-100 mg total) by mouth daily as needed.    Dispense:  20 tablet    Refill:  0      Procedures: No procedures performed   Clinical Data: No additional findings.   Subjective: Chief Complaint  Patient presents with   Left Shoulder - Pain    HPI Wesley Larson is a 46 year old gentleman here for evaluation of chronic left shoulder pain for about a year.  He is a referral from Dr. Jake Samples of  neurosurgery.  He recently had an MRI which showed rotator cuff pathology and possible labral pathology.  He does Holiday representative.  He is left-hand dominant.  He has had 2 cortisone injections by Dr. Jordan Likes without significant relief.  He feels pain to the lateral deltoid and along the anterior joint line.  Review of Systems  Constitutional: Negative.   All other systems reviewed and are negative.    Objective: Vital Signs: There were no vitals taken for this visit.  Physical Exam Vitals and nursing note reviewed.  Constitutional:      Appearance: He is well-developed.  HENT:     Head: Normocephalic and atraumatic.  Eyes:     Pupils: Pupils are equal, round, and reactive to light.  Pulmonary:     Effort: Pulmonary effort is normal.  Abdominal:     Palpations: Abdomen is soft.  Musculoskeletal:        General: Normal range of motion.     Cervical back: Neck supple.  Skin:    General: Skin is warm.  Neurological:     Mental Status: He is alert and oriented to person, place, and time.  Psychiatric:        Behavior: Behavior normal.        Thought Content: Thought content normal.        Judgment: Judgment normal.     Ortho Exam Examination of the left  shoulder shows global pain to attempted range of motion.  He has normal passive range of motion.  He has pain with impingement and empty can testing.  Pain with speeds test.  AC joint is nontender. Specialty Comments:  No specialty comments available.  Imaging: No results found.   PMFS History: Patient Active Problem List   Diagnosis Date Noted   Impingement syndrome of left shoulder 03/14/2022   Nontraumatic complete tear of left rotator cuff 03/14/2022   Cervical radiculopathy 10/05/2021   Capsulitis of shoulder, left 04/19/2021   Tobacco use disorder    Polysubstance abuse (HCC)    Altered mental status 09/11/2015   Altered level of consciousness    Hypothermia    Bradycardia    Drug abuse (HCC)    Past Medical  History:  Diagnosis Date   Asthma     No family history on file.  History reviewed. No pertinent surgical history. Social History   Occupational History   Not on file  Tobacco Use   Smoking status: Every Day    Packs/day: 0.50    Types: Cigarettes   Smokeless tobacco: Never  Vaping Use   Vaping Use: Never used  Substance and Sexual Activity   Alcohol use: No   Drug use: No   Sexual activity: Not on file

## 2022-03-20 ENCOUNTER — Encounter: Payer: Self-pay | Admitting: Orthopaedic Surgery

## 2022-03-20 NOTE — Telephone Encounter (Signed)
Is he scheduled for surgery?

## 2022-03-21 ENCOUNTER — Other Ambulatory Visit: Payer: Self-pay | Admitting: Orthopaedic Surgery

## 2022-03-21 ENCOUNTER — Other Ambulatory Visit: Payer: Self-pay | Admitting: Physician Assistant

## 2022-03-21 ENCOUNTER — Telehealth: Payer: Self-pay | Admitting: Orthopaedic Surgery

## 2022-03-21 MED ORDER — TRAMADOL HCL 50 MG PO TABS: 50.0000 mg | ORAL_TABLET | Freq: Every day | ORAL | 2 refills | Status: DC | PRN

## 2022-03-21 NOTE — Telephone Encounter (Signed)
Patient need refill on Tramadol. Needs a PA for filling RX please confirm with patient [phone]

## 2022-03-21 NOTE — Telephone Encounter (Signed)
sent 

## 2022-03-22 ENCOUNTER — Other Ambulatory Visit: Payer: Self-pay | Admitting: Physician Assistant

## 2022-03-22 MED ORDER — ACETAMINOPHEN-CODEINE 300-30 MG PO TABS: 1.0000 | ORAL_TABLET | Freq: Three times a day (TID) | ORAL | 0 refills | Status: DC | PRN

## 2022-03-22 NOTE — Telephone Encounter (Signed)
Ok, thanks.

## 2022-03-22 NOTE — Telephone Encounter (Signed)
Lets try tylenol 3 for now.  I have sent in

## 2022-03-28 ENCOUNTER — Institutional Professional Consult (permissible substitution): Payer: Commercial Managed Care - PPO | Admitting: Pulmonary Disease

## 2022-03-29 ENCOUNTER — Other Ambulatory Visit: Payer: Self-pay

## 2022-03-30 ENCOUNTER — Other Ambulatory Visit: Payer: Self-pay | Admitting: Physician Assistant

## 2022-03-30 ENCOUNTER — Telehealth: Payer: Self-pay | Admitting: Orthopaedic Surgery

## 2022-03-30 MED ORDER — ACETAMINOPHEN-CODEINE 300-30 MG PO TABS: 1.0000 | ORAL_TABLET | Freq: Three times a day (TID) | ORAL | 0 refills | Status: DC | PRN

## 2022-03-30 NOTE — Telephone Encounter (Signed)
sent 

## 2022-03-30 NOTE — Telephone Encounter (Signed)
Patient's wife Merry Proud called advised the pharmacy need to get a prior auth before the Rx for Tylenol with Codeine [phone]

## 2022-04-05 ENCOUNTER — Encounter: Payer: Self-pay | Admitting: Orthopaedic Surgery

## 2022-04-05 ENCOUNTER — Other Ambulatory Visit: Payer: Self-pay | Admitting: Physician Assistant

## 2022-04-06 ENCOUNTER — Other Ambulatory Visit: Payer: Self-pay | Admitting: Physician Assistant

## 2022-04-06 MED ORDER — ACETAMINOPHEN-CODEINE 300-30 MG PO TABS: 1.0000 | ORAL_TABLET | Freq: Three times a day (TID) | ORAL | 0 refills | Status: DC | PRN

## 2022-04-06 NOTE — Telephone Encounter (Signed)
sent 

## 2022-04-10 ENCOUNTER — Other Ambulatory Visit: Payer: Self-pay | Admitting: Physician Assistant

## 2022-04-11 NOTE — Progress Notes (Signed)
Called pt to complete pre op phone call for Rotator cuff surgery scheduled on 04/19/22. Pt reports that he is still getting over Covid (+04/03/22). Pt is still c/o cough, chest congestion. He is taking prednisone  and other otc meds to manage symptoms. LVM with Debbie to postpone case 4 weeks from resolution of symptoms. Pt verbalized understanding that surgery would need to be postponed until he has recovered.

## 2022-04-12 ENCOUNTER — Other Ambulatory Visit: Payer: Self-pay | Admitting: Physician Assistant

## 2022-04-12 ENCOUNTER — Encounter: Payer: Self-pay | Admitting: Orthopaedic Surgery

## 2022-04-12 MED ORDER — ACETAMINOPHEN-CODEINE 300-30 MG PO TABS: 1.0000 | ORAL_TABLET | Freq: Three times a day (TID) | ORAL | 0 refills | Status: DC | PRN

## 2022-04-12 NOTE — Telephone Encounter (Signed)
Sent in

## 2022-04-18 ENCOUNTER — Telehealth: Payer: Self-pay | Admitting: Orthopedic Surgery

## 2022-04-18 ENCOUNTER — Encounter: Payer: Self-pay | Admitting: Orthopaedic Surgery

## 2022-04-18 MED ORDER — TRAMADOL HCL 50 MG PO TABS: 50.0000 mg | ORAL_TABLET | Freq: Four times a day (QID) | ORAL | 0 refills | Status: AC | PRN

## 2022-04-19 ENCOUNTER — Encounter (HOSPITAL_BASED_OUTPATIENT_CLINIC_OR_DEPARTMENT_OTHER): Admission: RE | Payer: Self-pay | Source: Ambulatory Visit

## 2022-04-19 ENCOUNTER — Ambulatory Visit (HOSPITAL_BASED_OUTPATIENT_CLINIC_OR_DEPARTMENT_OTHER)
Admission: RE | Admit: 2022-04-19 | Payer: Commercial Managed Care - PPO | Source: Ambulatory Visit | Admitting: Orthopaedic Surgery

## 2022-04-19 ENCOUNTER — Other Ambulatory Visit: Payer: Self-pay | Admitting: Physician Assistant

## 2022-04-19 SURGERY — SHOULDER ARTHROSCOPY WITH ROTATOR CUFF REPAIR AND SUBACROMIAL DECOMPRESSION
Anesthesia: General | Laterality: Left

## 2022-04-19 MED ORDER — ACETAMINOPHEN-CODEINE 300-30 MG PO TABS: 1.0000 | ORAL_TABLET | Freq: Three times a day (TID) | ORAL | 0 refills | Status: DC | PRN

## 2022-04-19 NOTE — Telephone Encounter (Signed)
Sent in tylenol 3

## 2022-04-19 NOTE — Telephone Encounter (Signed)
Patient had covid and is still having some upper respiratory symptoms so his surgery is being rescheduled. He was trying to figure out when that surgery was happening. I told him that Dr. Warren Danes surgery scheduler should be getting in touch with him to pick out new dates. He can also call the office starting tomorrow (12/27) during regular hours to get in touch with Dr. Warren Danes staff. I explained that I personally do not know when Dr. Roda Shutters has time so I cannot help him there.   He also let me know he was having a lot of pain in his shoulders. He has been previously prescribed tramadol, so a refill was provided to him today.

## 2022-04-19 NOTE — Telephone Encounter (Signed)
Is he taking the tramadol?

## 2022-04-27 ENCOUNTER — Encounter: Payer: Commercial Managed Care - PPO | Admitting: Orthopaedic Surgery

## 2022-04-27 ENCOUNTER — Other Ambulatory Visit: Payer: Self-pay | Admitting: Physician Assistant

## 2022-04-27 ENCOUNTER — Encounter: Payer: Self-pay | Admitting: Orthopaedic Surgery

## 2022-04-27 MED ORDER — ACETAMINOPHEN-CODEINE 300-30 MG PO TABS: 1.0000 | ORAL_TABLET | Freq: Three times a day (TID) | ORAL | 0 refills | Status: DC | PRN

## 2022-04-27 NOTE — Telephone Encounter (Signed)
sent 

## 2022-05-02 ENCOUNTER — Encounter: Payer: Self-pay | Admitting: Orthopaedic Surgery

## 2022-05-02 ENCOUNTER — Other Ambulatory Visit: Payer: Self-pay | Admitting: Physician Assistant

## 2022-05-02 MED ORDER — ACETAMINOPHEN-CODEINE 300-30 MG PO TABS: 1.0000 | ORAL_TABLET | Freq: Three times a day (TID) | ORAL | 0 refills | Status: DC | PRN

## 2022-05-02 NOTE — Telephone Encounter (Signed)
Sent in

## 2022-05-03 ENCOUNTER — Other Ambulatory Visit: Payer: Self-pay | Admitting: Physician Assistant

## 2022-05-03 ENCOUNTER — Encounter (HOSPITAL_BASED_OUTPATIENT_CLINIC_OR_DEPARTMENT_OTHER): Payer: Self-pay | Admitting: Orthopaedic Surgery

## 2022-05-03 MED ORDER — ACETAMINOPHEN-CODEINE 300-30 MG PO TABS: 1.0000 | ORAL_TABLET | Freq: Three times a day (TID) | ORAL | 0 refills | Status: DC | PRN

## 2022-05-03 NOTE — Telephone Encounter (Signed)
Sent in

## 2022-05-08 NOTE — Progress Notes (Signed)

## 2022-05-10 ENCOUNTER — Encounter: Payer: Self-pay | Admitting: Orthopaedic Surgery

## 2022-05-10 ENCOUNTER — Other Ambulatory Visit: Payer: Self-pay

## 2022-05-10 ENCOUNTER — Ambulatory Visit (HOSPITAL_BASED_OUTPATIENT_CLINIC_OR_DEPARTMENT_OTHER): Payer: Commercial Managed Care - PPO | Admitting: Certified Registered Nurse Anesthetist

## 2022-05-10 ENCOUNTER — Encounter (HOSPITAL_BASED_OUTPATIENT_CLINIC_OR_DEPARTMENT_OTHER): Payer: Self-pay | Admitting: Orthopaedic Surgery

## 2022-05-10 ENCOUNTER — Ambulatory Visit (HOSPITAL_BASED_OUTPATIENT_CLINIC_OR_DEPARTMENT_OTHER)
Admission: RE | Admit: 2022-05-10 | Discharge: 2022-05-10 | Disposition: A | Discharge status: Home / Self Care | Payer: Commercial Managed Care - PPO | Source: Ambulatory Visit | Attending: Orthopaedic Surgery | Admitting: Orthopaedic Surgery

## 2022-05-10 ENCOUNTER — Encounter (HOSPITAL_BASED_OUTPATIENT_CLINIC_OR_DEPARTMENT_OTHER)
Admission: RE | Disposition: A | Discharge status: Home / Self Care | Payer: Self-pay | Source: Ambulatory Visit | Attending: Orthopaedic Surgery

## 2022-05-10 DIAGNOSIS — M7542 Impingement syndrome of left shoulder: Secondary | ICD-10-CM | POA: Diagnosis present

## 2022-05-10 DIAGNOSIS — Z79899 Other long term (current) drug therapy: Secondary | ICD-10-CM | POA: Insufficient documentation

## 2022-05-10 DIAGNOSIS — M25812 Other specified joint disorders, left shoulder: Secondary | ICD-10-CM | POA: Diagnosis not present

## 2022-05-10 DIAGNOSIS — S43432A Superior glenoid labrum lesion of left shoulder, initial encounter: Secondary | ICD-10-CM

## 2022-05-10 DIAGNOSIS — S46012A Strain of muscle(s) and tendon(s) of the rotator cuff of left shoulder, initial encounter: Secondary | ICD-10-CM | POA: Diagnosis not present

## 2022-05-10 DIAGNOSIS — X58XXXA Exposure to other specified factors, initial encounter: Secondary | ICD-10-CM | POA: Insufficient documentation

## 2022-05-10 DIAGNOSIS — F17210 Nicotine dependence, cigarettes, uncomplicated: Secondary | ICD-10-CM | POA: Diagnosis not present

## 2022-05-10 DIAGNOSIS — M75122 Complete rotator cuff tear or rupture of left shoulder, not specified as traumatic: Secondary | ICD-10-CM | POA: Diagnosis not present

## 2022-05-10 DIAGNOSIS — J45909 Unspecified asthma, uncomplicated: Secondary | ICD-10-CM | POA: Insufficient documentation

## 2022-05-10 DIAGNOSIS — M75102 Unspecified rotator cuff tear or rupture of left shoulder, not specified as traumatic: Secondary | ICD-10-CM

## 2022-05-10 DIAGNOSIS — F1290 Cannabis use, unspecified, uncomplicated: Secondary | ICD-10-CM | POA: Insufficient documentation

## 2022-05-10 DIAGNOSIS — M7522 Bicipital tendinitis, left shoulder: Secondary | ICD-10-CM

## 2022-05-10 HISTORY — PX: SHOULDER ARTHROSCOPY WITH ROTATOR CUFF REPAIR AND SUBACROMIAL DECOMPRESSION: SHX5686

## 2022-05-10 SURGERY — SHOULDER ARTHROSCOPY WITH ROTATOR CUFF REPAIR AND SUBACROMIAL DECOMPRESSION
Anesthesia: General | Site: Shoulder | Laterality: Left

## 2022-05-10 MED ORDER — MIDAZOLAM HCL 2 MG/2ML IJ SOLN
INTRAMUSCULAR | Status: AC
  Filled 2022-05-10: qty 2

## 2022-05-10 MED ORDER — ONDANSETRON HCL 4 MG/2ML IJ SOLN
INTRAMUSCULAR | Status: DC | PRN
  Administered 2022-05-10: 4 mg via INTRAVENOUS

## 2022-05-10 MED ORDER — MEPERIDINE HCL 25 MG/ML IJ SOLN: 6.2500 mg | INTRAMUSCULAR | Status: DC | PRN

## 2022-05-10 MED ORDER — EPHEDRINE SULFATE (PRESSORS) 50 MG/ML IJ SOLN
INTRAMUSCULAR | Status: DC | PRN
  Administered 2022-05-10: 10 mg via INTRAVENOUS
  Administered 2022-05-10: 5 mg via INTRAVENOUS
  Administered 2022-05-10: 10 mg via INTRAVENOUS

## 2022-05-10 MED ORDER — OXYCODONE HCL 5 MG/5ML PO SOLN: 5.0000 mg | Freq: Once | ORAL | Status: DC | PRN

## 2022-05-10 MED ORDER — LIDOCAINE 2% (20 MG/ML) 5 ML SYRINGE
INTRAMUSCULAR | Status: AC
  Filled 2022-05-10: qty 5

## 2022-05-10 MED ORDER — SUGAMMADEX SODIUM 200 MG/2ML IV SOLN
INTRAVENOUS | Status: DC | PRN
  Administered 2022-05-10: 200 mg via INTRAVENOUS

## 2022-05-10 MED ORDER — ROCURONIUM BROMIDE 100 MG/10ML IV SOLN
INTRAVENOUS | Status: DC | PRN
  Administered 2022-05-10: 20 mg via INTRAVENOUS
  Administered 2022-05-10: 50 mg via INTRAVENOUS

## 2022-05-10 MED ORDER — CEFAZOLIN SODIUM-DEXTROSE 2-4 GM/100ML-% IV SOLN
2.0000 g | INTRAVENOUS | Status: AC
  Administered 2022-05-10: 2 g via INTRAVENOUS

## 2022-05-10 MED ORDER — MIDAZOLAM HCL 2 MG/2ML IJ SOLN
2.0000 mg | Freq: Once | INTRAMUSCULAR | Status: AC
  Administered 2022-05-10: 2 mg via INTRAVENOUS

## 2022-05-10 MED ORDER — FENTANYL CITRATE (PF) 100 MCG/2ML IJ SOLN
100.0000 ug | Freq: Once | INTRAMUSCULAR | Status: AC
  Administered 2022-05-10: 50 ug via INTRAVENOUS

## 2022-05-10 MED ORDER — EPHEDRINE 5 MG/ML INJ
INTRAVENOUS | Status: AC
  Filled 2022-05-10: qty 5

## 2022-05-10 MED ORDER — LIDOCAINE HCL (CARDIAC) PF 100 MG/5ML IV SOSY
PREFILLED_SYRINGE | INTRAVENOUS | Status: DC | PRN
  Administered 2022-05-10: 60 mg via INTRAVENOUS

## 2022-05-10 MED ORDER — AMISULPRIDE (ANTIEMETIC) 5 MG/2ML IV SOLN: 10.0000 mg | Freq: Once | INTRAVENOUS | Status: DC | PRN

## 2022-05-10 MED ORDER — OXYCODONE HCL 5 MG PO TABS: 5.0000 mg | ORAL_TABLET | Freq: Once | ORAL | Status: DC | PRN

## 2022-05-10 MED ORDER — PHENYLEPHRINE HCL (PRESSORS) 10 MG/ML IV SOLN
INTRAVENOUS | Status: DC | PRN
  Administered 2022-05-10 (×3): 80 ug via INTRAVENOUS

## 2022-05-10 MED ORDER — PROPOFOL 10 MG/ML IV BOLUS
INTRAVENOUS | Status: DC | PRN
  Administered 2022-05-10: 200 mg via INTRAVENOUS

## 2022-05-10 MED ORDER — ONDANSETRON HCL 4 MG PO TABS: 4.0000 mg | ORAL_TABLET | Freq: Three times a day (TID) | ORAL | 0 refills | Status: DC | PRN

## 2022-05-10 MED ORDER — PHENYLEPHRINE 80 MCG/ML (10ML) SYRINGE FOR IV PUSH (FOR BLOOD PRESSURE SUPPORT)
PREFILLED_SYRINGE | INTRAVENOUS | Status: AC
  Filled 2022-05-10: qty 10

## 2022-05-10 MED ORDER — PROMETHAZINE HCL 25 MG/ML IJ SOLN: 6.2500 mg | INTRAMUSCULAR | Status: DC | PRN

## 2022-05-10 MED ORDER — METHOCARBAMOL 750 MG PO TABS: 750.0000 mg | ORAL_TABLET | Freq: Two times a day (BID) | ORAL | 3 refills | Status: DC | PRN

## 2022-05-10 MED ORDER — DEXAMETHASONE SODIUM PHOSPHATE 10 MG/ML IJ SOLN
INTRAMUSCULAR | Status: AC
  Filled 2022-05-10: qty 1

## 2022-05-10 MED ORDER — OXYCODONE-ACETAMINOPHEN 5-325 MG PO TABS: 1.0000 | ORAL_TABLET | Freq: Two times a day (BID) | ORAL | 0 refills | Status: DC | PRN

## 2022-05-10 MED ORDER — FENTANYL CITRATE (PF) 100 MCG/2ML IJ SOLN
INTRAMUSCULAR | Status: AC
  Filled 2022-05-10: qty 2

## 2022-05-10 MED ORDER — ONDANSETRON HCL 4 MG/2ML IJ SOLN
INTRAMUSCULAR | Status: AC
  Filled 2022-05-10: qty 2

## 2022-05-10 MED ORDER — ACETAMINOPHEN 500 MG PO TABS
1000.0000 mg | ORAL_TABLET | Freq: Once | ORAL | Status: AC
  Administered 2022-05-10: 1000 mg via ORAL

## 2022-05-10 MED ORDER — CELECOXIB 200 MG PO CAPS
ORAL_CAPSULE | ORAL | Status: AC
  Filled 2022-05-10: qty 1

## 2022-05-10 MED ORDER — FENTANYL CITRATE (PF) 100 MCG/2ML IJ SOLN: 25.0000 ug | INTRAMUSCULAR | Status: DC | PRN

## 2022-05-10 MED ORDER — CELECOXIB 200 MG PO CAPS
200.0000 mg | ORAL_CAPSULE | Freq: Once | ORAL | Status: AC
  Administered 2022-05-10: 200 mg via ORAL

## 2022-05-10 MED ORDER — PROPOFOL 10 MG/ML IV BOLUS
INTRAVENOUS | Status: AC
  Filled 2022-05-10: qty 20

## 2022-05-10 MED ORDER — BUPIVACAINE-EPINEPHRINE (PF) 0.25% -1:200000 IJ SOLN
INTRAMUSCULAR | Status: AC
  Filled 2022-05-10: qty 30

## 2022-05-10 MED ORDER — CEFAZOLIN SODIUM-DEXTROSE 2-4 GM/100ML-% IV SOLN
INTRAVENOUS | Status: AC
  Filled 2022-05-10: qty 100

## 2022-05-10 MED ORDER — FENTANYL CITRATE (PF) 100 MCG/2ML IJ SOLN
INTRAMUSCULAR | Status: DC | PRN
  Administered 2022-05-10: 50 ug via INTRAVENOUS

## 2022-05-10 MED ORDER — ROCURONIUM BROMIDE 10 MG/ML (PF) SYRINGE
PREFILLED_SYRINGE | INTRAVENOUS | Status: AC
  Filled 2022-05-10: qty 10

## 2022-05-10 MED ORDER — DEXAMETHASONE SODIUM PHOSPHATE 4 MG/ML IJ SOLN
INTRAMUSCULAR | Status: DC | PRN
  Administered 2022-05-10: 8 mg via INTRAVENOUS

## 2022-05-10 MED ORDER — SODIUM CHLORIDE 0.9 % IR SOLN
Status: DC | PRN
  Administered 2022-05-10: 3000 mL

## 2022-05-10 MED ORDER — MIDAZOLAM HCL 5 MG/5ML IJ SOLN
INTRAMUSCULAR | Status: DC | PRN
  Administered 2022-05-10: 2 mg via INTRAVENOUS

## 2022-05-10 MED ORDER — ACETAMINOPHEN 500 MG PO TABS
ORAL_TABLET | ORAL | Status: AC
  Filled 2022-05-10: qty 2

## 2022-05-10 MED ORDER — LACTATED RINGERS IV SOLN
INTRAVENOUS | Status: DC
Start: 2022-05-10 — End: 2022-05-10

## 2022-05-10 MED ORDER — LACTATED RINGERS IV SOLN: INTRAVENOUS | Status: DC

## 2022-05-10 SURGICAL SUPPLY — 76 items
ANCHOR SUT 1.8 FIBERTAK SB KL (Anchor) IMPLANT
ANCHOR SUT FBRTK 2.6 SOFT 1.7 (Anchor) IMPLANT
ANCHOR SWIVELOCK BIO 4.75X19.1 (Anchor) IMPLANT
BENZOIN TINCTURE PRP APPL 2/3 (GAUZE/BANDAGES/DRESSINGS) IMPLANT
BLADE SURG 15 STRL LF DISP TIS (BLADE) IMPLANT
BLADE SURG 15 STRL SS (BLADE) ×1
BURR OVAL 8 FLU 4.0X13 (MISCELLANEOUS) ×1 IMPLANT
CANNULA 5.75X71 LONG (CANNULA) ×1 IMPLANT
CANNULA SHOULDER 7CM (CANNULA) IMPLANT
CANNULA TWIST IN 8.25X7CM (CANNULA) IMPLANT
CLSR STERI-STRIP ANTIMIC 1/2X4 (GAUZE/BANDAGES/DRESSINGS) IMPLANT
COOLER ICEMAN CLASSIC (MISCELLANEOUS) ×1 IMPLANT
COVER MAYO STAND STRL (DRAPES) IMPLANT
DISSECTOR  3.8MM X 13CM (MISCELLANEOUS) ×1
DISSECTOR 3.8MM X 13CM (MISCELLANEOUS) ×1 IMPLANT
DRAPE IMP U-DRAPE 54X76 (DRAPES) ×1 IMPLANT
DRAPE INCISE IOBAN 66X45 STRL (DRAPES) ×1 IMPLANT
DRAPE POUCH INSTRU U-SHP 10X18 (DRAPES) ×1 IMPLANT
DRAPE STERI 35X30 U-POUCH (DRAPES) ×1 IMPLANT
DRAPE U-SHAPE 47X51 STRL (DRAPES) ×2 IMPLANT
DRAPE U-SHAPE 76X120 STRL (DRAPES) ×2 IMPLANT
DURAPREP 26ML APPLICATOR (WOUND CARE) ×2 IMPLANT
ELECT REM PT RETURN 9FT ADLT (ELECTROSURGICAL) ×1
ELECTRODE REM PT RTRN 9FT ADLT (ELECTROSURGICAL) IMPLANT
GAUZE PAD ABD 8X10 STRL (GAUZE/BANDAGES/DRESSINGS) ×2 IMPLANT
GAUZE SPONGE 4X4 12PLY STRL (GAUZE/BANDAGES/DRESSINGS) ×2 IMPLANT
GAUZE XEROFORM 1X8 LF (GAUZE/BANDAGES/DRESSINGS) ×1 IMPLANT
GLOVE ECLIPSE 7.0 STRL STRAW (GLOVE) ×2 IMPLANT
GLOVE INDICATOR 7.0 STRL GRN (GLOVE) ×1 IMPLANT
GLOVE INDICATOR 7.5 STRL GRN (GLOVE) ×1 IMPLANT
GLOVE SURG SYN 7.5  E (GLOVE) ×2
GLOVE SURG SYN 7.5 E (GLOVE) ×2 IMPLANT
GLOVE SURG SYN 7.5 PF PI (GLOVE) ×1 IMPLANT
GOWN STRL REUS W/ TWL LRG LVL3 (GOWN DISPOSABLE) ×1 IMPLANT
GOWN STRL REUS W/ TWL XL LVL3 (GOWN DISPOSABLE) IMPLANT
GOWN STRL REUS W/TWL LRG LVL3 (GOWN DISPOSABLE) ×1
GOWN STRL REUS W/TWL XL LVL3 (GOWN DISPOSABLE) ×1
GOWN STRL SURGICAL XL XLNG (GOWN DISPOSABLE) ×2 IMPLANT
KIT STR SPEAR 1.8 FBRTK DISP (KITS) IMPLANT
MANIFOLD NEPTUNE II (INSTRUMENTS) ×1 IMPLANT
NDL HD SCORPION MEGA LOADER (NEEDLE) IMPLANT
NDL SCORPION MULTI FIRE (NEEDLE) IMPLANT
NEEDLE SCORPION MULTI FIRE (NEEDLE) IMPLANT
PACK ARTHROSCOPY DSU (CUSTOM PROCEDURE TRAY) ×1 IMPLANT
PACK BASIN DAY SURGERY FS (CUSTOM PROCEDURE TRAY) ×1 IMPLANT
PAD COLD SHLDR WRAP-ON (PAD) ×1 IMPLANT
PAD ORTHO SHOULDER 7X19 LRG (SOFTGOODS) IMPLANT
PENCIL SMOKE EVACUATOR (MISCELLANEOUS) IMPLANT
PORT APPOLLO RF 90DEGREE MULTI (SURGICAL WAND) ×1 IMPLANT
SHEET MEDIUM DRAPE 40X70 STRL (DRAPES) IMPLANT
SLEEVE SCD COMPRESS KNEE MED (STOCKING) ×1 IMPLANT
SLING ARM FOAM STRAP LRG (SOFTGOODS) IMPLANT
SPIKE FLUID TRANSFER (MISCELLANEOUS) IMPLANT
SPONGE T-LAP 4X18 ~~LOC~~+RFID (SPONGE) IMPLANT
SUCTION FRAZIER HANDLE 10FR (MISCELLANEOUS) ×1
SUCTION TUBE FRAZIER 10FR DISP (MISCELLANEOUS) IMPLANT
SUPPORT WRAP ARM LG (MISCELLANEOUS) IMPLANT
SUT ETHILON 3 0 PS 1 (SUTURE) ×1 IMPLANT
SUT FIBERWIRE #2 38 T-5 BLUE (SUTURE)
SUT MON AB 4-0 PS1 27 (SUTURE) IMPLANT
SUT TIGER TAPE 7 IN WHITE (SUTURE) IMPLANT
SUT VIC AB 0 CT1 27 (SUTURE) ×1
SUT VIC AB 0 CT1 27XBRD ANBCTR (SUTURE) IMPLANT
SUT VIC AB 2-0 CT1 27 (SUTURE) ×1
SUT VIC AB 2-0 CT1 TAPERPNT 27 (SUTURE) IMPLANT
SUTURE FIBERWR #2 38 T-5 BLUE (SUTURE) IMPLANT
SUTURE TAPE 1.3 40 TPR END (SUTURE) IMPLANT
SUTURE TAPE TIGERLINK 1.3MM BL (SUTURE) IMPLANT
SUTURETAPE 1.3 40 TPR END (SUTURE)
SUTURETAPE TIGERLINK 1.3MM BL (SUTURE)
SYS FBRTK BUTTON 2.6 (Anchor) ×1 IMPLANT
SYSTEM FBRTK BUTTON 2.6 (Anchor) IMPLANT
TAPE FIBER 2MM 7IN #2 BLUE (SUTURE) IMPLANT
TOWEL GREEN STERILE FF (TOWEL DISPOSABLE) ×1 IMPLANT
TUBE CONNECTING 20X1/4 (TUBING) ×1 IMPLANT
TUBING ARTHROSCOPY IRRIG 16FT (MISCELLANEOUS) ×1 IMPLANT

## 2022-05-10 NOTE — Anesthesia Preprocedure Evaluation (Signed)
Anesthesia Evaluation  Patient identified by MRN, date of birth, ID band Patient awake    Reviewed: Allergy & Precautions, NPO status , Patient's Chart, lab work & pertinent test results  Airway Mallampati: II  TM Distance: >3 FB Neck ROM: Full    Dental no notable dental hx. (+) Dental Advisory Given, Teeth Intact   Pulmonary asthma , Current Smoker and Patient abstained from smoking.   Pulmonary exam normal breath sounds clear to auscultation       Cardiovascular negative cardio ROS Normal cardiovascular exam Rhythm:Regular Rate:Normal     Neuro/Psych negative neurological ROS     GI/Hepatic negative GI ROS, Neg liver ROS,,,  Endo/Other  negative endocrine ROS    Renal/GU negative Renal ROS     Musculoskeletal  (+) Arthritis ,    Abdominal   Peds  Hematology negative hematology ROS (+)   Anesthesia Other Findings   Reproductive/Obstetrics                              Anesthesia Physical Anesthesia Plan  ASA: 2  Anesthesia Plan: General   Post-op Pain Management: Tylenol PO (pre-op)*, Celebrex PO (pre-op)* and Regional block*   Induction: Intravenous  PONV Risk Score and Plan: 1 and Ondansetron, Dexamethasone, Treatment may vary due to age or medical condition and Midazolam  Airway Management Planned: Oral ETT  Additional Equipment: None  Intra-op Plan:   Post-operative Plan: Extubation in OR  Informed Consent: I have reviewed the patients History and Physical, chart, labs and discussed the procedure including the risks, benefits and alternatives for the proposed anesthesia with the patient or authorized representative who has indicated his/her understanding and acceptance.     Dental advisory given  Plan Discussed with: CRNA  Anesthesia Plan Comments:          Anesthesia Quick Evaluation

## 2022-05-10 NOTE — Anesthesia Procedure Notes (Signed)
Procedure Name: Intubation Date/Time: 05/10/2022 11:54 AM  Performed by: Bufford Spikes, CRNAPre-anesthesia Checklist: Patient identified, Emergency Drugs available, Suction available and Patient being monitored Patient Re-evaluated:Patient Re-evaluated prior to induction Oxygen Delivery Method: Circle system utilized Preoxygenation: Pre-oxygenation with 100% oxygen Induction Type: IV induction Ventilation: Mask ventilation without difficulty Laryngoscope Size: Miller and 2 Grade View: Grade II Tube type: Oral Tube size: 7.0 mm Number of attempts: 1 Airway Equipment and Method: Stylet and Oral airway Placement Confirmation: ETT inserted through vocal cords under direct vision, positive ETCO2 and breath sounds checked- equal and bilateral Secured at: 21 cm Tube secured with: Tape Dental Injury: Teeth and Oropharynx as per pre-operative assessment

## 2022-05-10 NOTE — H&P (Signed)
PREOPERATIVE H&P  Chief Complaint: left shoulder rotator cuff tear, possible slap tear, impingement  HPI: Wesley Lemmons. is a 47 y.o. male who presents for surgical treatment of left shoulder rotator cuff tear, possible slap tear, impingement.  He denies any changes in medical history.  Past Medical History:  Diagnosis Date   Asthma    History reviewed. No pertinent surgical history. Social History   Socioeconomic History   Marital status: Married    Spouse name: Not on file   Number of children: Not on file   Years of education: Not on file   Highest education level: Not on file  Occupational History   Not on file  Tobacco Use   Smoking status: Every Day    Packs/day: 0.50    Types: Cigarettes   Smokeless tobacco: Never  Vaping Use   Vaping Use: Never used  Substance and Sexual Activity   Alcohol use: No   Drug use: Yes    Types: Marijuana    Comment: a couple of times per week   Sexual activity: Yes  Other Topics Concern   Not on file  Social History Narrative   Not on file   Social Determinants of Health   Financial Resource Strain: Not on file  Food Insecurity: Not on file  Transportation Needs: Not on file  Physical Activity: Not on file  Stress: Not on file  Social Connections: Not on file   History reviewed. No pertinent family history. Allergies  Allergen Reactions   Penicillins Other (See Comments)    Pt doesn't know the reaction, had it as a child.   Prior to Admission medications   Medication Sig Start Date End Date Taking? Authorizing Provider  acetaminophen-codeine (TYLENOL #3) 300-30 MG tablet Take 1 tablet by mouth every 8 (eight) hours as needed for moderate pain. 05/03/22  Yes Aundra Dubin, PA-C  albuterol (PROVENTIL HFA;VENTOLIN HFA) 108 (90 Base) MCG/ACT inhaler Inhale 1-2 puffs into the lungs every 4 (four) hours as needed for wheezing or shortness of breath (or cough). 09/03/17  Yes Street, [place], PA-C  gabapentin  (NEURONTIN) 300 MG capsule Take 1 capsule (300 mg total) by mouth 3 (three) times daily. 10/05/21  Yes Rosemarie Ax, MD  methocarbamol (ROBAXIN) 750 MG tablet Take 1 tablet (750 mg total) by mouth 2 (two) times daily as needed for muscle spasms. 05/10/22  Yes Leandrew Koyanagi, MD  Multiple Vitamin (MULTIVITAMIN WITH MINERALS) TABS tablet Take 1 tablet by mouth daily.   Yes [provider]  ondansetron (ZOFRAN) 4 MG tablet Take 1-2 tablets (4-8 mg total) by mouth every 8 (eight) hours as needed for nausea or vomiting. 05/10/22  Yes Leandrew Koyanagi, MD  oxyCODONE-acetaminophen (PERCOCET) 5-325 MG tablet Take 1-2 tablets by mouth 2 (two) times daily as needed for severe pain. 05/10/22  Yes Leandrew Koyanagi, MD  predniSONE (DELTASONE) 5 MG tablet Take 6 pills for first day, 5 pills second day, 4 pills third day, 3 pills fourth day, 2 pills the fifth day, and 1 pill sixth day. 10/05/21  Yes Rosemarie Ax, MD  ibuprofen (ADVIL) 600 MG tablet Take 1 tablet (600 mg total) by mouth every 6 (six) hours as needed. 09/11/19   Domenic Moras, PA-C     Positive ROS: All other systems have been reviewed and were otherwise negative with the exception of those mentioned in the HPI and as above.  Physical Exam: General: Alert, no acute distress Cardiovascular: No  pedal edema Respiratory: No cyanosis, no use of accessory musculature GI: abdomen soft Skin: No lesions in the area of chief complaint Neurologic: Sensation intact distally Psychiatric: Patient is competent for consent with normal mood and affect Lymphatic: no lymphedema  MUSCULOSKELETAL: exam stable  Assessment: left shoulder rotator cuff tear, possible slap tear, impingement  Plan: Plan for Procedure(s): LEFT SHOULDER ARTHROSCOPY, ROTATOR CUFF REPAIR, EXTENSIVE DEBRIDEMENT, POSSIBLE BICEPS TENODESIS  The risks benefits and alternatives were discussed with the patient including but not limited to the risks of nonoperative treatment, versus  surgical intervention including infection, bleeding, nerve injury,  blood clots, cardiopulmonary complications, morbidity, mortality, among others, and they were willing to proceed.   Eduard Roux, MD 05/10/2022 11:40 AM

## 2022-05-10 NOTE — Transfer of Care (Signed)
Immediate Anesthesia Transfer of Care Note  Patient: Wesley Larson.  Procedure(s) Performed: LEFT SHOULDER ARTHROSCOPY, ROTATOR CUFF REPAIR, EXTENSIVE DEBRIDEMENT, POSSIBLE BICEPS TENODESIS (Left)  Patient Location: PACU  Anesthesia Type:General  Level of Consciousness: awake, alert , and oriented  Airway & Oxygen Therapy: Patient Spontanous Breathing and Patient connected to face mask oxygen  Post-op Assessment: Report given to RN and Post -op Vital signs reviewed and stable  Post vital signs: Reviewed and stable  Last Vitals:  Vitals Value Taken Time  BP    Temp    Pulse 64 05/10/22 1354  Resp 13 05/10/22 1354  SpO2 100 % 05/10/22 1354  Vitals shown include unvalidated device data.  Last Pain:  Vitals:   05/10/22 0955  TempSrc: Oral  PainSc: 2       Patients Stated Pain Goal: 5 (03/49/17 9150)  Complications: No notable events documented.

## 2022-05-10 NOTE — Progress Notes (Signed)
Assisted Dr. Lissa Hoard with left, interscalene , ultrasound guided block. Side rails up, monitors on throughout procedure. See vital signs in flow sheet. Tolerated Procedure well.

## 2022-05-10 NOTE — Op Note (Signed)
Date of Surgery: 05/10/2022  INDICATIONS: The patient is a 47 year old male with left shoulder pain that has failed conservative treatment;  The patient did consent to the procedure after discussion of the risks and benefits.  DIAGNOSES: Left shoulder, acute atraumatic rotator cuff tear, SLAP tear, and subacromial impingement.  POST-OPERATIVE DIAGNOSIS: same  PROCEDURE: Arthroscopic extensive debridement - 29823 Subdeltoid Bursa, Supraspinatus Tendon, Anterior Labrum, Superior Labrum, and supraspinatus tendon Arthroscopic subacromial decompression - 70962 Arthroscopic rotator cuff repair - 83662 Arthroscopic biceps tenodesis - 94765   OPERATIVE FINDING: Exam under anesthesia: Normal Articular space: Normal Chondral surfaces: Normal Biceps:  mild tendinopathy, degenerative type 2 SLAP tear, degenerative anterior labral tear Subscapularis: Intact  Supraspinatus: Complete tear 5 mm x 7 mm crescent shaped tear Infraspinatus: Intact   SURGEON: N. Eduard Roux, M.D.  ASSIST: Madalyn Rob, PA-C  ANESTHESIA:  general, regional  IV FLUIDS AND URINE: See anesthesia.  ESTIMATED BLOOD LOSS: minimal mL.  IMPLANTS:  Implant Name Type Inv. Item Serial No. Manufacturer Lot No. LRB No. Used Action  ANCHOR SUT FBRTK 2.6 SOFT 1.7 - [id number] Anchor ANCHOR SUT FBRTK 2.6 SOFT 1.7  [employer] [id number] Left 1 Implanted  [facility] BIO 4.75X19.1 - [id number] Anchor ANCHOR [place]OCK BIO 4.75X19.1  [employer] [id number] Left 1 Implanted  SYS FBRTK BUTTON 2.6 - [id number] Anchor SYS FBRTK BUTTON 2.6  [employer] [id number] Left 1 Implanted  ANCHOR SUT 1.8 FIBERTAK SB KL - [id number] Anchor ANCHOR SUT 1.8 FIBERTAK SB KL  [employer] [id number] Left 1 Implanted    COMPLICATIONS: None.  DESCRIPTION OF PROCEDURE: The patient was brought to the operating room and placed supine on the operating table.  The patient had been signed prior to the procedure and this was documented. The patient had  the anesthesia placed by the anesthesiologist.  A time-out was performed to confirm that this was the correct patient, site, side and location. The patient did receive antibiotics prior to the incision and was re-dosed during the procedure as needed at indicated intervals.  The patient was then positioned into the beach chair position with all bony prominences well padded and neutral C spine. The patient had the operative extremity prepped and draped in the standard surgical fashion.  Examination of the left shoulder joint under anesthesia was unremarkable. Incisions were made for shoulder arthroscopy portals and diagnostic shoulder arthroscopy ensued.  Chondral surfaces were unremarkable.  Mild tendinopathy of the biceps tendon.  Subscapularis tendon unremarkable.  No significant synovitis.  The bicipital labral complex showed a degenerative type II SLAP tear when probed.  Anterior labrum was mildly degenerative.  These were all gently debrided back to stable margins.  Biceps tenotomy was performed arthroscopically for later biceps tenodesis.  We then looked superiorly and found a full-thickness anterior supraspinatus tear which measured about 5 x 7 mm with minimal retraction.  The infraspinatus was unremarkable.  The arthroscope was then repositioned into the subacromial space.  Bursectomy was performed.  Type III acromion was encountered and subacromial decompression with acromioplasty was performed.  AC joint was unremarkable.  We then externally rotated the shoulder and was able to find the supraspinatus tear.  This was localized with a spinal needle.  The supraspinatus tendon was then gently debrided with a shaver back to stable margins.  The greater tuberosity bone was excoriated with a bur down to bleeding bone.  The arthroscope was then removed.  A separate incision was then made down the arm from the anterolateral corner  of the acromion.  The subcutaneous tissue was elevated off of the deltoid.  The  raphae between the anterior middle deltoid was developed retractors were placed.  The remaining portion of the bursa was resected for visualization.  We first addressed the supraspinatus tear.  A one by one double row repair was performed by using an all suture anchor for the medial row at the articular margin and a 4.75 mm swivel lock for the lateral row.  I was very happy with the approximation of the repair and a watertight closure.  We then externally rotated the arm to turn our attention to the biceps tenodesis.  The lesser tuberosity was palpated and the transverse humeral ligament was elevated from the lateral side to deliver the biceps tendon.  This was then whipstitched and the proper tension was found.  The bone was again prepared and excoriated with a bur.  I used 2 fiber tacks in order to perform the biceps tenodesis.  I was able to get excellent approximation of the tendon along the bone.  The transverse humeral ligament was repaired back with interrupted 0 Vicryl.  The surgical sites were then thoroughly irrigated and closed with 2-0 Vicryl and 4-0 Monocryl and Steri-Strips.  Sterile dressings were applied.  Patient tolerated the procedure well had no many complications. Tawanna Cooler, my PA, was a medical necessity for the entirety of the surgery including opening, closing, limb positioning, retracting, exposing, and repairing.  POSTOPERATIVE PLAN: Patient will remain in the shoulder sling at all times.  He will be discharged home and follow-up in the office in 1 week.  Azucena Cecil, MD [facility] 1:29 PM

## 2022-05-10 NOTE — Discharge Instructions (Addendum)
Post Anesthesia Home Care Instructions  Activity: Get plenty of rest for the remainder of the day. A responsible individual must stay with you for 24 hours following the procedure.  For the next 24 hours, DO NOT: -Drive a car -Paediatric nurse -Drink alcoholic beverages -Take any medication unless instructed by your physician -Make any legal decisions or sign important papers.  Meals: Start with liquid foods such as gelatin or soup. Progress to regular foods as tolerated. Avoid greasy, spicy, heavy foods. If nausea and/or vomiting occur, drink only clear liquids until the nausea and/or vomiting subsides. Call your physician if vomiting continues.  Special Instructions/Symptoms: Your throat may feel dry or sore from the anesthesia or the breathing tube placed in your throat during surgery. If this causes discomfort, gargle with warm salt water. The discomfort should disappear within 24 hours.  If you had a scopolamine patch placed behind your ear for the management of post- operative nausea and/or vomiting:  1. The medication in the patch is effective for 72 hours, after which it should be removed.  Wrap patch in a tissue and discard in the trash. Wash hands thoroughly with soap and water. 2. You may remove the patch earlier than 72 hours if you experience unpleasant side effects which may include dry mouth, dizziness or visual disturbances. 3. Avoid touching the patch. Wash your hands with soap and water after contact with the patch.      Regional Anesthesia Blocks  1. Numbness or the inability to move the "blocked" extremity may last from 3-48 hours after placement. The length of time depends on the medication injected and your individual response to the medication. If the numbness is not going away after 48 hours, call your surgeon.  2. The extremity that is blocked will need to be protected until the numbness is gone and the  Strength has returned. Because you cannot feel it, you  will need to take extra care to avoid injury. Because it may be weak, you may have difficulty moving it or using it. You may not know what position it is in without looking at it while the block is in effect.  3. For blocks in the legs and feet, returning to weight bearing and walking needs to be done carefully. You will need to wait until the numbness is entirely gone and the strength has returned. You should be able to move your leg and foot normally before you try and bear weight or walk. You will need someone to be with you when you first try to ensure you do not fall and possibly risk injury.  4. Bruising and tenderness at the needle site are common side effects and will resolve in a few days.  5. Persistent numbness or new problems with movement should be communicated to the surgeon or the [facility] [phone] [place] [phone]).    Information for Discharge Teaching: EXPAREL (bupivacaine liposome injectable suspension)   Your surgeon or anesthesiologist gave you EXPAREL(bupivacaine) to help control your pain after surgery.  EXPAREL is a local anesthetic that provides pain relief by numbing the tissue around the surgical site. EXPAREL is designed to release pain medication over time and can control pain for up to 72 hours. Depending on how you respond to EXPAREL, you may require less pain medication during your recovery.  Possible side effects: Temporary loss of sensation or ability to move in the area where bupivacaine was injected. Nausea, vomiting, constipation Rarely, numbness and tingling in  your mouth or lips, lightheadedness, or anxiety may occur. Call your doctor right away if you think you may be experiencing any of these sensations, or if you have other questions regarding possible side effects.  Follow all other discharge instructions given to you by your surgeon or nurse. Eat a healthy diet and drink plenty of water or other  fluids.  If you return to the hospital for any reason within 96 hours following the administration of EXPAREL, it is important for health care providers to know that you have received this anesthetic. A teal colored band has been placed on your arm with the date, time and amount of EXPAREL you have received in order to alert and inform your health care providers. Please leave this armband in place for the full 96 hours following administration, and then you may remove the band.   Next dose of Tylenol may be taken at 6p      Post-operative patient instructions  Shoulder Arthroscopy   Ice:  Place intermittent ice or cooler pack over your shoulder, 30 minutes on and 30 minutes off.  Continue this for the first 72 hours after surgery, then save ice for use after therapy sessions or on more active days.   Weight:  You may NOT bear weight on your arm as your symptoms allow. Motion:  Do NOT perform shoulder range of motion.  Wear sling at all times. Dressing:  Perform 1st dressing change at 2 days postoperative. A moderate amount of blood tinged drainage is to be expected.  So if you bleed through the dressing on the first or second day or if you have fevers, it is fine to change the dressing/check the wounds early and redress wound.  If it bleeds through again, or if the incisions are leaking frank blood, please call the office. May change dressing every 1-2 days thereafter to help watch wounds. Can purchase Tegaderm (or 97M Nexcare) water resistant dressings at local pharmacy / [facility]. Shower:  Light shower is ok after 2 days.  Please take shower, NO bath. Recover with gauze and ace wrap to help keep wounds protected.   Pain medication:  A narcotic pain medication has been prescribed.  Take as directed.  Typically you need narcotic pain medication more regularly during the first 3 to 5 days after surgery.  Decrease your use of the medication as the pain improves.  Narcotics can sometimes cause  constipation, even after a few doses.  If you have problems with constipation, you can take an over the counter stool softener or light laxative.  If you have persistent problems, please notify your physician's office. Physical therapy: Additional activity guidelines to be provided by your physician or physical therapist at follow-up visits.  Driving: Do not recommend driving x 2 weeks post surgical, especially if surgery performed on right side. Should not drive while taking narcotic pain medications. It typically takes at least 2 weeks to restore sufficient neuromuscular function for normal reaction times for driving safety.  Call [phone] for questions or problems. Evenings you will be forwarded to the hospital operator.  Ask for the orthopaedic physician on call. Please call if you experience:    Redness, foul smelling, or persistent drainage from the surgical site  worsening shoulder pain and swelling not responsive to medication  any calf pain and or swelling of the lower leg  temperatures greater than 101.5 F other questions or concerns   Thank you for allowing [place] to be a part of your care.

## 2022-05-10 NOTE — Anesthesia Postprocedure Evaluation (Signed)
Anesthesia Post Note  Patient: Wesley Larson.  Procedure(s) Performed: LEFT SHOULDER ARTHROSCOPY, ROTATOR CUFF REPAIR, EXTENSIVE DEBRIDEMENT, BICEPS TENODESIS AND SUBACROMIAL DECOMPRESSION (Left: Shoulder)     Patient location during evaluation: PACU Anesthesia Type: General Level of consciousness: sedated and patient cooperative Pain management: pain level controlled Vital Signs Assessment: post-procedure vital signs reviewed and stable Respiratory status: spontaneous breathing Cardiovascular status: stable Anesthetic complications: no   No notable events documented.  Last Vitals:  Vitals:   05/10/22 1424 05/10/22 1430  BP:  119/77  Pulse: 75   Resp: 14 16  Temp:  36.8 C  SpO2: 97% 97%    Last Pain:  Vitals:   05/10/22 1430  TempSrc:   PainSc: 0-No pain                 Nolon Nations

## 2022-05-11 ENCOUNTER — Encounter (HOSPITAL_BASED_OUTPATIENT_CLINIC_OR_DEPARTMENT_OTHER): Payer: Self-pay | Admitting: Orthopaedic Surgery

## 2022-05-11 ENCOUNTER — Other Ambulatory Visit: Payer: Self-pay | Admitting: Orthopaedic Surgery

## 2022-05-11 ENCOUNTER — Encounter: Payer: Self-pay | Admitting: Orthopaedic Surgery

## 2022-05-11 MED ORDER — KETOROLAC TROMETHAMINE 10 MG PO TABS: 10.0000 mg | ORAL_TABLET | Freq: Two times a day (BID) | ORAL | 0 refills | Status: DC | PRN

## 2022-05-11 MED ORDER — HYDROMORPHONE HCL 2 MG PO TABS: 2.0000 mg | ORAL_TABLET | ORAL | 0 refills | Status: DC | PRN

## 2022-05-11 NOTE — Telephone Encounter (Signed)
I sent toradol and small supply of dilaudid

## 2022-05-14 ENCOUNTER — Other Ambulatory Visit (HOSPITAL_BASED_OUTPATIENT_CLINIC_OR_DEPARTMENT_OTHER): Payer: Self-pay | Admitting: Orthopaedic Surgery

## 2022-05-14 MED ORDER — MELOXICAM 15 MG PO TABS: 15.0000 mg | ORAL_TABLET | Freq: Every day | ORAL | 0 refills | Status: DC

## 2022-05-14 MED ORDER — OXYCODONE HCL 5 MG PO TABS: 5.0000 mg | ORAL_TABLET | ORAL | 0 refills | Status: DC | PRN

## 2022-05-14 MED ORDER — ACETAMINOPHEN 500 MG PO TABS: 500.0000 mg | ORAL_TABLET | Freq: Three times a day (TID) | ORAL | 0 refills | Status: AC

## 2022-05-15 ENCOUNTER — Other Ambulatory Visit: Payer: Self-pay | Admitting: Orthopaedic Surgery

## 2022-05-15 ENCOUNTER — Other Ambulatory Visit: Payer: Self-pay | Admitting: Physician Assistant

## 2022-05-17 ENCOUNTER — Encounter: Payer: Self-pay | Admitting: Orthopaedic Surgery

## 2022-05-17 ENCOUNTER — Ambulatory Visit (INDEPENDENT_AMBULATORY_CARE_PROVIDER_SITE_OTHER): Payer: Commercial Managed Care - PPO | Admitting: Orthopaedic Surgery

## 2022-05-17 ENCOUNTER — Other Ambulatory Visit: Payer: Self-pay | Admitting: Orthopaedic Surgery

## 2022-05-17 ENCOUNTER — Other Ambulatory Visit (HOSPITAL_BASED_OUTPATIENT_CLINIC_OR_DEPARTMENT_OTHER): Payer: Self-pay | Admitting: Orthopaedic Surgery

## 2022-05-17 DIAGNOSIS — M75122 Complete rotator cuff tear or rupture of left shoulder, not specified as traumatic: Secondary | ICD-10-CM

## 2022-05-17 DIAGNOSIS — M7542 Impingement syndrome of left shoulder: Secondary | ICD-10-CM

## 2022-05-17 MED ORDER — OXYCODONE HCL 5 MG PO TABS: 5.0000 mg | ORAL_TABLET | ORAL | 0 refills | Status: DC | PRN

## 2022-05-17 NOTE — Progress Notes (Signed)
   Post-Op Visit Note   Patient: Wesley Larson.           Date of Birth: 08-10-1975           MRN: [id number] Visit Date: 05/17/2022 PCP: Patient, No Pcp Per   Assessment & Plan:  Chief Complaint:  Chief Complaint  Patient presents with   Left Shoulder - Follow-up    Left shoulder arthroscopy 05/10/2022   Visit Diagnoses:  1. Impingement syndrome of left shoulder   2. Nontraumatic complete tear of left rotator cuff     Plan: Octavia Bruckner is 1 week status post left rotator cuff repair bicep tenodesis.  He is doing as well as expected.  No unexpected or abnormal complaints.  Examination left shoulder shows healed surgical incisions.  No signs of infection.  Expected postoperative swelling.  No neurovascular compromise.  Arthroscopic photos were reviewed with the patient.  We will send him to PT for gentle range of motion.  He is to wear the sling at all times for total of 6 weeks other than when he is doing PT.  Recheck in another month.  Follow-Up Instructions: Return in about 4 weeks (around 06/14/2022).   Orders:  Orders Placed This Encounter  Procedures   Ambulatory referral to Physical Therapy   No orders of the defined types were placed in this encounter.   Imaging: No results found.  PMFS History: Patient Active Problem List   Diagnosis Date Noted   Impingement syndrome of left shoulder 03/14/2022   Nontraumatic complete tear of left rotator cuff 03/14/2022   Cervical radiculopathy 10/05/2021   Capsulitis of shoulder, left 04/19/2021   Tobacco use disorder    Polysubstance abuse ([place])    Altered mental status 09/11/2015   Altered level of consciousness    Hypothermia    Bradycardia    Drug abuse ([place])    Past Medical History:  Diagnosis Date   Asthma     No family history on file.  Past Surgical History:  Procedure Laterality Date   SHOULDER ARTHROSCOPY WITH ROTATOR CUFF REPAIR AND SUBACROMIAL DECOMPRESSION Left 05/10/2022   Procedure: LEFT SHOULDER  ARTHROSCOPY, ROTATOR CUFF REPAIR, EXTENSIVE DEBRIDEMENT, BICEPS TENODESIS AND SUBACROMIAL DECOMPRESSION;  Surgeon: Leandrew Koyanagi, MD;  Location: [place];  Service: Orthopedics;  Laterality: Left;   Social History   Occupational History   Not on file  Tobacco Use   Smoking status: Every Day    Packs/day: 0.50    Types: Cigarettes   Smokeless tobacco: Never  Vaping Use   Vaping Use: Never used  Substance and Sexual Activity   Alcohol use: No   Drug use: Yes    Types: Marijuana    Comment: a couple of times per week   Sexual activity: Yes

## 2022-05-18 ENCOUNTER — Encounter (HOSPITAL_BASED_OUTPATIENT_CLINIC_OR_DEPARTMENT_OTHER): Payer: Self-pay | Admitting: Orthopaedic Surgery

## 2022-05-18 ENCOUNTER — Other Ambulatory Visit: Payer: Self-pay | Admitting: Physician Assistant

## 2022-05-18 MED ORDER — OXYCODONE HCL 5 MG PO TABS: 5.0000 mg | ORAL_TABLET | Freq: Four times a day (QID) | ORAL | 0 refills | Status: DC | PRN

## 2022-05-18 NOTE — Telephone Encounter (Signed)
Sent in.  Just make him aware that the frequency has changed

## 2022-05-22 ENCOUNTER — Other Ambulatory Visit: Payer: Self-pay | Admitting: Orthopaedic Surgery

## 2022-05-22 ENCOUNTER — Encounter: Payer: Self-pay | Admitting: Orthopaedic Surgery

## 2022-05-22 NOTE — Telephone Encounter (Signed)
See message.  Thanks.

## 2022-05-23 ENCOUNTER — Other Ambulatory Visit: Payer: Self-pay | Admitting: Physician Assistant

## 2022-05-23 MED ORDER — HYDROCODONE-ACETAMINOPHEN 5-325 MG PO TABS: 1.0000 | ORAL_TABLET | Freq: Four times a day (QID) | ORAL | 0 refills | Status: DC | PRN

## 2022-05-23 NOTE — Telephone Encounter (Signed)
Will you let patient know we need to wean to [place] and that I have sent in

## 2022-05-24 ENCOUNTER — Encounter: Payer: Self-pay | Admitting: Orthopaedic Surgery

## 2022-05-25 ENCOUNTER — Other Ambulatory Visit: Payer: Self-pay | Admitting: Physician Assistant

## 2022-05-25 MED ORDER — OXYCODONE-ACETAMINOPHEN 5-325 MG PO TABS: 1.0000 | ORAL_TABLET | Freq: Two times a day (BID) | ORAL | 0 refills | Status: DC | PRN

## 2022-05-25 NOTE — Telephone Encounter (Signed)
Sent in percocet, but we will need to continue to wean down

## 2022-06-01 ENCOUNTER — Encounter: Payer: Self-pay | Admitting: Orthopaedic Surgery

## 2022-06-02 ENCOUNTER — Encounter: Payer: Self-pay | Admitting: Orthopaedic Surgery

## 2022-06-02 ENCOUNTER — Telehealth: Payer: Self-pay | Admitting: Orthopaedic Surgery

## 2022-06-02 ENCOUNTER — Other Ambulatory Visit: Payer: Self-pay | Admitting: Physician Assistant

## 2022-06-02 MED ORDER — ACETAMINOPHEN-CODEINE 300-30 MG PO TABS: 1.0000 | ORAL_TABLET | Freq: Three times a day (TID) | ORAL | 0 refills | Status: DC | PRN

## 2022-06-02 NOTE — Telephone Encounter (Signed)
Called and notified patient.

## 2022-06-02 NOTE — Telephone Encounter (Signed)
Patient called advised his Rx Tylenol 3 was sent again to the wrong pharmacy. [facility] [address].    Patient said [facility] need to be removed from his chart. Patient said he is having a problem with [facility] now.  The number to contact patient is [phone]

## 2022-06-02 NOTE — Telephone Encounter (Signed)
sent

## 2022-06-02 NOTE — Telephone Encounter (Signed)
Sent  in tylenol 3

## 2022-06-08 ENCOUNTER — Encounter: Payer: Self-pay | Admitting: Orthopaedic Surgery

## 2022-06-09 ENCOUNTER — Other Ambulatory Visit: Payer: Self-pay | Admitting: Orthopaedic Surgery

## 2022-06-09 MED ORDER — TRAMADOL HCL 50 MG PO TABS: 100.0000 mg | ORAL_TABLET | Freq: Two times a day (BID) | ORAL | 0 refills | Status: DC | PRN

## 2022-06-09 MED ORDER — OXYCODONE-ACETAMINOPHEN 5-325 MG PO TABS: 1.0000 | ORAL_TABLET | Freq: Every day | ORAL | 0 refills | Status: DC | PRN

## 2022-06-12 ENCOUNTER — Telehealth: Payer: Self-pay | Admitting: Orthopaedic Surgery

## 2022-06-12 ENCOUNTER — Other Ambulatory Visit: Payer: Self-pay | Admitting: Physician Assistant

## 2022-06-12 ENCOUNTER — Encounter: Payer: Self-pay | Admitting: Orthopaedic Surgery

## 2022-06-12 MED ORDER — ACETAMINOPHEN-CODEINE 300-30 MG PO TABS: 1.0000 | ORAL_TABLET | Freq: Two times a day (BID) | ORAL | 0 refills | Status: DC | PRN

## 2022-06-12 NOTE — Telephone Encounter (Signed)
[facility] and [facility] in [facility], [facility] [address] / [phone]

## 2022-06-12 NOTE — Telephone Encounter (Signed)
Sent in but decreased frequency

## 2022-06-13 ENCOUNTER — Other Ambulatory Visit: Payer: Self-pay | Admitting: Physician Assistant

## 2022-06-13 MED ORDER — ACETAMINOPHEN-CODEINE 300-30 MG PO TABS: 1.0000 | ORAL_TABLET | Freq: Two times a day (BID) | ORAL | 0 refills | Status: DC | PRN

## 2022-06-13 NOTE — Therapy (Incomplete)
OUTPATIENT PHYSICAL THERAPY UPPER EXTREMITY EVALUATION   Patient Name: Wesley Larson. MRN: [id number] DOB:01/16/1976, 47 y.o., male 71 Date: 06/13/2022  END OF SESSION:   Past Medical History:  Diagnosis Date   Asthma    Past Surgical History:  Procedure Laterality Date   SHOULDER ARTHROSCOPY WITH ROTATOR CUFF REPAIR AND SUBACROMIAL DECOMPRESSION Left 05/10/2022   Procedure: LEFT SHOULDER ARTHROSCOPY, ROTATOR CUFF REPAIR, EXTENSIVE DEBRIDEMENT, BICEPS TENODESIS AND SUBACROMIAL DECOMPRESSION;  Surgeon: Leandrew Koyanagi, MD;  Location: [facility];  Service: Orthopedics;  Laterality: Left;   Patient Active Problem List   Diagnosis Date Noted   Impingement syndrome of left shoulder 03/14/2022   Nontraumatic complete tear of left rotator cuff 03/14/2022   Cervical radiculopathy 10/05/2021   Capsulitis of shoulder, left 04/19/2021   Tobacco use disorder    Polysubstance abuse ([place])    Altered mental status 09/11/2015   Altered level of consciousness    Hypothermia    Bradycardia    Drug abuse ([place])     PCP: none listed in epic  REFERRING PROVIDER: Leandrew Koyanagi, MD  REFERRING DIAG: M75.42 (ICD-10-CM) - Impingement syndrome of left shoulder M75.122 (ICD-10-CM) - Nontraumatic complete tear of left rotator cuff  THERAPY DIAG:  No diagnosis found.  Rationale for Evaluation and Treatment: Rehabilitation  ONSET DATE: surgery 05/10/2022  SUBJECTIVE:                                                                                                                                                                                      SUBJECTIVE STATEMENT: ***  PERTINENT HISTORY: ***  PAIN:  NPRS scale: ***/10 Pain location: Lt shoulder  Pain description: *** Aggravating factors: *** Relieving factors: ***  PRECAUTIONS: Shoulder - Per md note on 05/17/2022 - 6 weeks of sling wearing, "gentle range of motion"  WEIGHT BEARING RESTRICTIONS: Yes  shoulder  FALLS:  Has patient fallen in last 6 months? {fallsyesno:27318}  LIVING ENVIRONMENT: Lives with: {OPRC lives with:25569::"lives with their family"} Lives in: {Lives in:25570} Stairs: {opstairs:27293} Has following equipment at home: {Assistive devices:23999}  OCCUPATION: ***  PLOF: Independent  PATIENT GOALS:***  Next MD visit:   OBJECTIVE:   DIAGNOSTIC FINDINGS:  ***  PATIENT SURVEYS:  FOTO intake:     predicted:    COGNITION: Overall cognitive status: WFL     SENSATION: {sensation:27233}  POSTURE: ***  UPPER EXTREMITY ROM:   ROM Right eval Left eval  Shoulder flexion    Shoulder extension    Shoulder abduction    Shoulder adduction    Shoulder internal rotation    Shoulder external rotation    Elbow flexion  Elbow extension    Wrist flexion    Wrist extension    Wrist ulnar deviation    Wrist radial deviation    Wrist pronation    Wrist supination    (Blank rows = not tested)  UPPER EXTREMITY MMT:  MMT Right eval Left eval  Shoulder flexion    Shoulder extension    Shoulder abduction    Shoulder adduction    Shoulder internal rotation    Shoulder external rotation    Middle trapezius    Lower trapezius    Elbow flexion    Elbow extension    Wrist flexion    Wrist extension    Wrist ulnar deviation    Wrist radial deviation    Wrist pronation    Wrist supination    Grip strength (lbs)    (Blank rows = not tested)  SHOULDER SPECIAL TESTS: Impingement tests: {shoulder impingement test:25231:a} SLAP lesions: {SLAP lesions:25232} Instability tests: {shoulder instability test:25233} Rotator cuff assessment: {rotator cuff assessment:25234} Biceps assessment: {biceps assessment:25235}  JOINT MOBILITY TESTING:  ***  PALPATION:  ***   TODAY'S TREATMENT:                                                                            DATE: Therex: HEP instruction/performance c cues for techniques, handout provided.  Trial  set performed of each for comprehension and symptom assessment.  See below for exercise list   PATIENT EDUCATION: Education details: HEP, POC Person educated: Patient Education method: Explanation, Demonstration, Verbal cues, and Handouts Education comprehension: verbalized understanding, returned demonstration, and verbal cues required  HOME EXERCISE PROGRAM: ***  ASSESSMENT:  CLINICAL IMPRESSION: Patient is a 47 y.o. who comes to clinic with complaints of Lt shoulder pain s/p recent rotator cuff repair c biceps tenodesis on 05/10/2022 with mobility, strength and movement coordination deficits that impair their ability to perform usual daily and recreational functional activities without increase difficulty/symptoms at this time.  Patient to benefit from skilled PT services to address impairments and limitations to improve to previous level of function without restriction secondary to condition.   OBJECTIVE IMPAIRMENTS: decreased activity tolerance, decreased coordination, decreased endurance, decreased mobility, decreased ROM, decreased strength, hypomobility, increased edema, impaired perceived functional ability, impaired flexibility, impaired UE functional use, improper body mechanics, and pain.   ACTIVITY LIMITATIONS: carrying, lifting, sleeping, dressing, reach over head, and hygiene/grooming  PARTICIPATION LIMITATIONS: {participationrestrictions:25113}  PERSONAL FACTORS: {Personal factors:25162} are also affecting patient's functional outcome.   REHAB POTENTIAL: Good  CLINICAL DECISION MAKING: Stable/uncomplicated  EVALUATION COMPLEXITY: Low   GOALS: Goals reviewed with patient? Yes  SHORT TERM GOALS: (target date for Short term goals are 3 weeks ***)  1.Patient will demonstrate independent use of home exercise program to maintain progress from in clinic treatments. Goal status: New  LONG TERM GOALS: (target dates for all long term goals are 10 weeks  *** )   1.  Patient will demonstrate/report pain at worst less than or equal to 2/10 to facilitate minimal limitation in daily activity secondary to pain symptoms. Goal status: New   2. Patient will demonstrate independent use of home exercise program to facilitate ability to maintain/progress functional gains from skilled physical therapy services. Goal status: New  3. Patient will demonstrate FOTO outcome > or = *** % to indicate reduced disability due to condition. Goal status: New   4.  Patient will demonstrate *** UE MMT 5/5 throughout to facilitate lifting, reaching, carrying at [facility] in daily activity.   Goal status: New   5.  Patient will demonstrate *** GH joint AROM WFL s symptoms to facilitate usual overhead reaching, self care, dressing at PLOF.    Goal status: New   6.  *** Goal status: New   7.  *** Goal Status: New  PLAN:  PT FREQUENCY: 1-2x/week  PT DURATION: 10 weeks  PLANNED INTERVENTIONS: Therapeutic exercises, Therapeutic activity, Neuro Muscular re-education, Balance training, Gait training, Patient/Family education, Joint mobilization, Stair training, DME instructions, Dry Needling, Electrical stimulation, Traction, Cryotherapy, vasopneumatic device Moist heat, Taping, Ultrasound, Ionotophoresis 34m/ml Dexamethasone, and Manual therapy.  All included unless contraindicated  PLAN FOR NEXT SESSION: Review HEP knowledge/results.    MScot Jun PT, DPT, OCS, ATC 06/13/22  9:08 AM

## 2022-06-13 NOTE — Telephone Encounter (Signed)
[facility] [address] Medication was on backorder and the prescription has been cancelled.

## 2022-06-13 NOTE — Telephone Encounter (Signed)
I sent in to this pharmacy.  Can you call other pharmacy and make sure he did not also pick up there.  Seems like every refill he needs he is calling back to send to diff pharmacy

## 2022-06-14 ENCOUNTER — Ambulatory Visit (INDEPENDENT_AMBULATORY_CARE_PROVIDER_SITE_OTHER): Payer: Commercial Managed Care - PPO | Admitting: Physical Therapy

## 2022-06-14 ENCOUNTER — Encounter: Payer: Self-pay | Admitting: Physical Therapy

## 2022-06-14 ENCOUNTER — Ambulatory Visit (INDEPENDENT_AMBULATORY_CARE_PROVIDER_SITE_OTHER): Payer: Commercial Managed Care - PPO | Admitting: Physician Assistant

## 2022-06-14 ENCOUNTER — Encounter: Payer: Self-pay | Admitting: Physician Assistant

## 2022-06-14 ENCOUNTER — Other Ambulatory Visit: Payer: Self-pay

## 2022-06-14 DIAGNOSIS — R600 Localized edema: Secondary | ICD-10-CM

## 2022-06-14 DIAGNOSIS — M25512 Pain in left shoulder: Secondary | ICD-10-CM

## 2022-06-14 DIAGNOSIS — M6281 Muscle weakness (generalized): Secondary | ICD-10-CM | POA: Diagnosis not present

## 2022-06-14 DIAGNOSIS — Z9889 Other specified postprocedural states: Secondary | ICD-10-CM

## 2022-06-14 NOTE — Progress Notes (Signed)
   Post-Op Visit Note   Patient: Wesley Larson.           Date of Birth: [birth year]           MRN: [id number] Visit Date: 06/14/2022 PCP: Patient, No Pcp Per   Assessment & Plan:  Chief Complaint:  Chief Complaint  Patient presents with   Left Shoulder - Follow-up    Left shoulder arthroscopy 05/10/2022   Visit Diagnoses:  1. S/P arthroscopy of left shoulder     Plan: Patient is a pleasant 47 year old gentleman who comes in today 5 weeks status post left shoulder arthroscopic debridement, decompression, rotator cuff repair and biceps tenodesis 05/10/2022.  He has been doing okay.  He has been compliant wearing his sling.  He is taking Tylenol threes for pain but is having trouble sleeping as he is unable to get comfortable.  He is scheduled to start physical therapy today.  Examination of his left shoulder: Fully healed surgical portals without complication.  At this point, he will wear his sling for another week.  Continue with physical therapy.  Follow-up in 6 weeks for recheck.  Call with concerns or questions.  Follow-Up Instructions: Return in about 6 weeks (around 07/26/2022).   Orders:  No orders of the defined types were placed in this encounter.  No orders of the defined types were placed in this encounter.   Imaging: No results found.  PMFS History: Patient Active Problem List   Diagnosis Date Noted   Impingement syndrome of left shoulder 03/14/2022   Nontraumatic complete tear of left rotator cuff 03/14/2022   Cervical radiculopathy 10/05/2021   Capsulitis of shoulder, left 04/19/2021   Tobacco use disorder    Polysubstance abuse ([place])    Altered mental status 09/11/2015   Altered level of consciousness    Hypothermia    Bradycardia    Drug abuse ([place])    Past Medical History:  Diagnosis Date   Asthma     No family history on file.  Past Surgical History:  Procedure Laterality Date   SHOULDER ARTHROSCOPY WITH ROTATOR CUFF REPAIR AND SUBACROMIAL  DECOMPRESSION Left 05/10/2022   Procedure: LEFT SHOULDER ARTHROSCOPY, ROTATOR CUFF REPAIR, EXTENSIVE DEBRIDEMENT, BICEPS TENODESIS AND SUBACROMIAL DECOMPRESSION;  Surgeon: Leandrew Koyanagi, MD;  Location: [facility];  Service: Orthopedics;  Laterality: Left;   Social History   Occupational History   Not on file  Tobacco Use   Smoking status: Every Day    Packs/day: 0.50    Types: Cigarettes   Smokeless tobacco: Never  Vaping Use   Vaping Use: Never used  Substance and Sexual Activity   Alcohol use: No   Drug use: Yes    Types: Marijuana    Comment: a couple of times per week   Sexual activity: Yes

## 2022-06-14 NOTE — Therapy (Signed)
OUTPATIENT PHYSICAL THERAPY SHOULDER EVALUATION   Patient Name: Wesley Larson. MRN: [id number] DOB:13-Jul-1975, 47 y.o., male Today's Date: 06/14/2022  END OF SESSION:  PT End of Session - 06/14/22 1137     Visit Number 1    Number of Visits 15    Date for PT Re-Evaluation 09/06/22    PT Start Time 1140    PT Stop Time 1220    PT Time Calculation (min) 40 min    Activity Tolerance Patient tolerated treatment well    Behavior During Therapy WFL for tasks assessed/performed             Past Medical History:  Diagnosis Date   Asthma    Past Surgical History:  Procedure Laterality Date   SHOULDER ARTHROSCOPY WITH ROTATOR CUFF REPAIR AND SUBACROMIAL DECOMPRESSION Left 05/10/2022   Procedure: LEFT SHOULDER ARTHROSCOPY, ROTATOR CUFF REPAIR, EXTENSIVE DEBRIDEMENT, BICEPS TENODESIS AND SUBACROMIAL DECOMPRESSION;  Surgeon: Leandrew Koyanagi, MD;  Location: [facility];  Service: Orthopedics;  Laterality: Left;   Patient Active Problem List   Diagnosis Date Noted   Impingement syndrome of left shoulder 03/14/2022   Nontraumatic complete tear of left rotator cuff 03/14/2022   Cervical radiculopathy 10/05/2021   Capsulitis of shoulder, left 04/19/2021   Tobacco use disorder    Polysubstance abuse ([place])    Altered mental status 09/11/2015   Altered level of consciousness    Hypothermia    Bradycardia    Drug abuse ([place])     PCP: no PCP  REFERRING PROVIDER: Leandrew Koyanagi, MD  REFERRING DIAG: M75.42 (ICD-10-CM) - Impingement syndrome of left shoulder M75.122 (ICD-10-CM) - Nontraumatic complete tear of left rotator cuff  THERAPY DIAG:  Acute pain of left shoulder  Muscle weakness (generalized)  Localized edema  Rationale for Evaluation and Treatment: Rehabilitation  ONSET DATE: S/p left rotator cuff repair, biceps tenodesis 05/10/2022  SUBJECTIVE:                                                                                                                                                                                       SUBJECTIVE STATEMENT: 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 He is left hand dominant  PERTINENT HISTORY: S/p left rotator cuff repair, biceps tenodesis 05/10/2022  PAIN:  Are you having pain? Yes: NPRS scale: fine when in sling and 7-8 outside of sling/10 Pain location: Lt shoulder Pain description: pressure Aggravating factors: being out of the sling, sleeping Relieving factors: sling,   PRECAUTIONS: Shoulder, RTC protocol  WEIGHT BEARING RESTRICTIONS: RTC protocol  FALLS:  Has patient fallen in last 6 months? No  OCCUPATION: Architect work  PLOF: Independent  PATIENT GOALS:return to Architect work, get back to working out  NEXT MD VISIT:   OBJECTIVE:   DIAGNOSTIC FINDINGS:    PATIENT SURVEYS:  Eval: FOTO 4% functional intake  COGNITION: Overall cognitive status: Within functional limits for tasks assessed     SENSATION: WFL  POSTURE:   UPPER EXTREMITY ROM:   Passive ROM Right eval Left Eval in supine   Shoulder flexion (tested with table slide)  85  Shoulder extension    Shoulder abduction  65  Shoulder adduction    Shoulder internal rotation  60  Shoulder external rotation  20  Elbow flexion    Elbow extension    Wrist flexion    Wrist extension    Wrist ulnar deviation    Wrist radial deviation    Wrist pronation    Wrist supination    (Blank rows = not tested)  UPPER EXTREMITY MMT:  MMT Left Did not test at Eval due to post op precaution   Shoulder flexion    Shoulder extension    Shoulder abduction    Shoulder adduction    Shoulder internal rotation    Shoulder external rotation    Middle trapezius    Lower trapezius    Elbow flexion    Elbow extension    Wrist flexion    Wrist extension    Wrist ulnar deviation    Wrist radial deviation    Wrist pronation     Wrist supination    Grip strength (lbs)    (Blank rows = not tested)  SHOULDER SPECIAL TESTS:   JOINT MOBILITY TESTING:    PALPATION:     TODAY'S TREATMENT:  Eval HEP creation and review with demonstration and trial set preformed, see below for details Left shoulder PROM gentle to tolerance, he has associated pain and muscle guarding wit hthis -Vasopnuematic device X 10 min, medium compression, 34 deg to Lt knee    PATIENT EDUCATION: Education details: HEP, PT plan of care Person educated: Patient Education method: Explanation, Demonstration, Verbal cues, and Handouts Education comprehension: verbalized understanding and needs further education   HOME EXERCISE PROGRAM: Access Code: [email or web address] URL: [email or web address] Date: 06/14/2022 Prepared by: Elsie Ra  Exercises - Circular Shoulder Pendulum with Table Support  - 3 x daily - 7 x weekly - 1-2 sets - 10 reps - Seated Shoulder Abduction Towel Slide at Table Top  - 2 x daily - 6 x weekly - 1 sets - 10 reps - 5 sec hold - Seated Shoulder Flexion Towel Slide at Table Top  - 2 x daily - 6 x weekly - 1 sets - 10 reps - 5 sec hold - Seated Shoulder External Rotation PROM on Table  - 2-3 x daily - 6 x weekly - 1 sets - 10 reps - 5 sec hold - Seated Gripping Towel  - 2 x daily - 6 x weekly - 1 sets - 20 reps - Seated Scapular Retraction  - 2 x daily - 6 x weekly - 1-2 sets - 10 reps  ASSESSMENT:  CLINICAL IMPRESSION: Patient referred to PT for S/p left rotator cuff repair, biceps tenodesis 05/10/2022. He has associated pain, weakness, and muscle guarding from this. Patient will benefit from skilled PT to address below impairments, limitations and improve overall function.  OBJECTIVE IMPAIRMENTS: decreased activity tolerance, decreased shoulder mobility, decreased ROM, decreased strength, impaired flexibility, impaired UE use,, and  pain.  ACTIVITY LIMITATIONS: reaching, lifting, carry,  cleaning, driving,  and or occupation  PERSONAL FACTORS: post op status also affecting patient's functional outcome.  REHAB POTENTIAL: Good  CLINICAL DECISION MAKING: Stable/uncomplicated  EVALUATION COMPLEXITY: Low    GOALS: Short term PT Goals Target date: 07/12/2022   Pt will be I and compliant with HEP. Baseline:  Goal status: New Pt will decrease pain by 25% overall Baseline: Goal status: New  Long term PT goals Target date:09/06/2022   Pt will improve Rt shoulder AROM to WNL to improve functional reaching Baseline: Goal status: New Pt will improve  Rt shoulder strength to at least 5-/5 MMT to improve functional strength Baseline: Goal status: New Pt will improve FOTO to at least 56% functional to show improved function Baseline: Goal status: New Pt will reduce pain to overall less than 3/10 with usual activity and work activity. Baseline: Goal status: New  PLAN: PT FREQUENCY: 1-2 times per week   PT DURATION: 6-12 weeks  PLANNED INTERVENTIONS (unless contraindicated): aquatic PT, Canalith repositioning, cryotherapy, Electrical stimulation, Iontophoresis with 4 mg/ml dexamethasome, Moist heat, traction, Ultrasound, gait training, Therapeutic exercise, balance training, neuromuscular re-education, patient/family education, prosthetic training, manual techniques, passive ROM, dry needling, taping, vasopnuematic device, vestibular, spinal manipulations, joint manipulations  PLAN FOR NEXT SESSION: review HEP and update PRN, RTC protocol of sling one more week and no AROM or biceps resistance until 6 weeks post op 06/21/22.

## 2022-06-15 ENCOUNTER — Encounter: Payer: Self-pay | Admitting: Orthopaedic Surgery

## 2022-06-16 ENCOUNTER — Other Ambulatory Visit: Payer: Self-pay | Admitting: Physician Assistant

## 2022-06-16 MED ORDER — HYDROCODONE-ACETAMINOPHEN 5-325 MG PO TABS: 1.0000 | ORAL_TABLET | Freq: Every day | ORAL | 0 refills | Status: DC | PRN

## 2022-06-16 NOTE — Telephone Encounter (Signed)
Sent in one small rx to take sparingly

## 2022-06-19 ENCOUNTER — Encounter: Payer: Self-pay | Admitting: Orthopaedic Surgery

## 2022-06-19 ENCOUNTER — Other Ambulatory Visit: Payer: Self-pay | Admitting: Orthopaedic Surgery

## 2022-06-19 NOTE — Telephone Encounter (Signed)
So no refill?

## 2022-06-19 NOTE — Telephone Encounter (Signed)
Let me know if he changes his mind

## 2022-06-20 MED ORDER — TRAMADOL HCL 50 MG PO TABS: 100.0000 mg | ORAL_TABLET | Freq: Two times a day (BID) | ORAL | 0 refills | Status: DC | PRN

## 2022-06-20 NOTE — Telephone Encounter (Signed)
sent 

## 2022-06-23 ENCOUNTER — Encounter: Payer: Self-pay | Admitting: Orthopaedic Surgery

## 2022-06-23 ENCOUNTER — Other Ambulatory Visit: Payer: Self-pay | Admitting: Physician Assistant

## 2022-06-23 ENCOUNTER — Ambulatory Visit (INDEPENDENT_AMBULATORY_CARE_PROVIDER_SITE_OTHER): Payer: Commercial Managed Care - PPO | Admitting: Rehabilitative and Restorative Service Providers"

## 2022-06-23 ENCOUNTER — Encounter: Payer: Self-pay | Admitting: Rehabilitative and Restorative Service Providers"

## 2022-06-23 DIAGNOSIS — R600 Localized edema: Secondary | ICD-10-CM | POA: Diagnosis not present

## 2022-06-23 DIAGNOSIS — M6281 Muscle weakness (generalized): Secondary | ICD-10-CM | POA: Diagnosis not present

## 2022-06-23 DIAGNOSIS — M25512 Pain in left shoulder: Secondary | ICD-10-CM

## 2022-06-23 MED ORDER — ACETAMINOPHEN-CODEINE 300-30 MG PO TABS: 1.0000 | ORAL_TABLET | Freq: Three times a day (TID) | ORAL | 0 refills | Status: DC | PRN

## 2022-06-23 NOTE — Telephone Encounter (Signed)
Sent to last pharmacy we sent this to

## 2022-06-23 NOTE — Telephone Encounter (Signed)
We cannot send in tylenol.  I actually just realized we just filled his tramadol on 2/27.  Can you call pharmacy I sent previous tylenol rx to and cancel it and also let patient know

## 2022-06-23 NOTE — Therapy (Signed)
OUTPATIENT PHYSICAL THERAPY TREATMENT   Patient Name: Wesley Larson. MRN: [id number] DOB:1975/08/06, 47 y.o., male Today's Date: 06/23/2022  END OF SESSION:  PT End of Session - 06/23/22 1050     Visit Number 2    Number of Visits 15    Date for PT Re-Evaluation 09/06/22    PT Start Time [id number]    PT Stop Time 1128    PT Time Calculation (min) 39 min    Activity Tolerance Patient tolerated treatment well    Behavior During Therapy WFL for tasks assessed/performed              Past Medical History:  Diagnosis Date   Asthma    Past Surgical History:  Procedure Laterality Date   SHOULDER ARTHROSCOPY WITH ROTATOR CUFF REPAIR AND SUBACROMIAL DECOMPRESSION Left 05/10/2022   Procedure: LEFT SHOULDER ARTHROSCOPY, ROTATOR CUFF REPAIR, EXTENSIVE DEBRIDEMENT, BICEPS TENODESIS AND SUBACROMIAL DECOMPRESSION;  Surgeon: Leandrew Koyanagi, MD;  Location: [place];  Service: Orthopedics;  Laterality: Left;   Patient Active Problem List   Diagnosis Date Noted   Impingement syndrome of left shoulder 03/14/2022   Nontraumatic complete tear of left rotator cuff 03/14/2022   Cervical radiculopathy 10/05/2021   Capsulitis of shoulder, left 04/19/2021   Tobacco use disorder    Polysubstance abuse ([place])    Altered mental status 09/11/2015   Altered level of consciousness    Hypothermia    Bradycardia    Drug abuse ([place])     PCP: no PCP  REFERRING PROVIDER: Leandrew Koyanagi, MD  REFERRING DIAG: M75.42 (ICD-10-CM) - Impingement syndrome of left shoulder M75.122 (ICD-10-CM) - Nontraumatic complete tear of left rotator cuff  THERAPY DIAG:  Acute pain of left shoulder  Muscle weakness (generalized)  Localized edema  Rationale for Evaluation and Treatment: Rehabilitation  ONSET DATE: S/p left rotator cuff repair, biceps tenodesis 05/10/2022  SUBJECTIVE:                                                                                                                                                                                       SUBJECTIVE STATEMENT: He indicated pain increase after performing exercise. He said consistent pain since last visit.    PERTINENT HISTORY: S/p left rotator cuff repair, biceps tenodesis 05/10/2022  PAIN:  NPRS scale: current 4-5/10.  Pain location: Lt shoulder Pain description: pressure Aggravating factors: being out of the sling, sleeping Relieving factors: sling,   PRECAUTIONS: Shoulder, RTC protocol  WEIGHT BEARING RESTRICTIONS: RTC protocol   FALLS:  Has patient fallen in last 6 months? No  OCCUPATION: Architect work  PLOF: Independent  PATIENT GOALS:return to Architect work,  get back to working out  OBJECTIVE:  PATIENT SURVEYS:  06/14/2022 Eval: FOTO 4% functional intake  COGNITION: 06/14/2022 Overall cognitive status: Within functional limits for tasks assessed     SENSATION: 06/14/2022 WFL  POSTURE:   UPPER EXTREMITY ROM:   Passive ROM Right eval Left Eval in supine  Left 06/23/2022  Shoulder flexion (tested with table slide)  85 Supine PROM: 65 deg limited by pain and guarding   Shoulder extension     Shoulder abduction  65   Shoulder adduction     Shoulder internal rotation  60   Shoulder external rotation  20   Elbow flexion     Elbow extension     Wrist flexion     Wrist extension     Wrist ulnar deviation     Wrist radial deviation     Wrist pronation     Wrist supination     (Blank rows = not tested)  UPPER EXTREMITY MMT:  MMT Left Did not test at Eval due to post op precaution   Shoulder flexion    Shoulder extension    Shoulder abduction    Shoulder adduction    Shoulder internal rotation    Shoulder external rotation    Middle trapezius    Lower trapezius    Elbow flexion    Elbow extension    Wrist flexion    Wrist extension    Wrist ulnar deviation    Wrist radial deviation    Wrist pronation    Wrist supination    Grip strength (lbs)    (Blank  rows = not tested)  SHOULDER SPECIAL TESTS:   JOINT MOBILITY TESTING:    PALPATION:     TODAY'S TREATMENT:  06/23/2022 Manual: Lt shoulder supine passive range, g2 inferior mobs to tolerance (poor tolerance noted)  Therex Supine passive range Lt shoulder c Rt arm moving flexion 2 x 10 (movement to tolerance)  Seated table ER forward lean for Lt shoulder 5 sec hold x 10  Seated scapular retraction 5 sec hold x 10   Vasopnuematic 10 min, medium compression, 34 deg to Lt shoulder  06/14/2022 HEP creation and review with demonstration and trial set preformed, see below for details Left shoulder PROM gentle to tolerance, he has associated pain and muscle guarding wit hthis -Vasopnuematic device X 10 min, medium compression, 34 deg to Lt knee    PATIENT EDUCATION:  Education details: HEP, PT plan of care Person educated: Patient Education method: Explanation, Demonstration, Verbal cues, and Handouts Education comprehension: verbalized understanding and needs further education   HOME EXERCISE PROGRAM: Access Code: [email or web address] URL: [email or web address] Date: 06/23/2022 Prepared by: Scot Jun  Exercises - Circular Shoulder Pendulum with Table Support  - 3 x daily - 7 x weekly - 1-2 sets - 10 reps - Seated Shoulder Abduction Towel Slide at Table Top  - 2 x daily - 6 x weekly - 1 sets - 10 reps - 5 sec hold - Seated Shoulder Flexion Towel Slide at Table Top  - 2 x daily - 6 x weekly - 1 sets - 10 reps - 5 sec hold - Seated Shoulder External Rotation PROM on Table  - 2-3 x daily - 6 x weekly - 1 sets - 10 reps - 5 sec hold - Seated Gripping Towel  - 2 x daily - 6 x weekly - 1 sets - 20 reps - Seated Scapular Retraction  - 2 x daily - 6 x weekly - 1-2 sets -  10 reps - Supine Shoulder Flexion PROM  - 2-3 x daily - 7 x weekly - 1-2 sets - 10 reps - 5 hold  ASSESSMENT:  CLINICAL IMPRESSION: Continued high severity and irritability of symptoms in all movements c  consistent guarding during attempts of passive range.   OBJECTIVE IMPAIRMENTS: decreased activity tolerance, decreased shoulder mobility, decreased ROM, decreased strength, impaired flexibility, impaired UE use,, and pain.  ACTIVITY LIMITATIONS: reaching, lifting, carry,  cleaning, driving, and or occupation  PERSONAL FACTORS: post op status also affecting patient's functional outcome.  REHAB POTENTIAL: Good  CLINICAL DECISION MAKING: Stable/uncomplicated  EVALUATION COMPLEXITY: Low    GOALS: Short term PT Goals Target date: 07/12/2022   Pt will be I and compliant with HEP. Baseline:  Goal status: on going 06/23/2022 Pt will decrease pain by 25% overall Baseline: Goal status: on going 06/23/2022  Long term PT goals Target date:09/06/2022   Pt will improve Rt shoulder AROM to WNL to improve functional reaching Baseline: Goal status: New Pt will improve  Rt shoulder strength to at least 5-/5 MMT to improve functional strength Baseline: Goal status: New Pt will improve FOTO to at least 56% functional to show improved function Baseline: Goal status: New Pt will reduce pain to overall less than 3/10 with usual activity and work activity. Baseline: Goal status: New  PLAN: PT FREQUENCY: 1-2 times per week   PT DURATION: 6-12 weeks  PLANNED INTERVENTIONS (unless contraindicated): aquatic PT, Canalith repositioning, cryotherapy, Electrical stimulation, Iontophoresis with 4 mg/ml dexamethasome, Moist heat, traction, Ultrasound, gait training, Therapeutic exercise, balance training, neuromuscular re-education, patient/family education, prosthetic training, manual techniques, passive ROM, dry needling, taping, vasopnuematic device, vestibular, spinal manipulations, joint manipulations  PLAN FOR NEXT SESSION: Progress as tolerated.   no AROM or biceps resistance until 6 weeks post op 06/21/22.   Scot Jun, PT, DPT, OCS, ATC 06/23/22  11:22 AM

## 2022-06-25 ENCOUNTER — Encounter: Payer: Self-pay | Admitting: Orthopaedic Surgery

## 2022-06-26 ENCOUNTER — Other Ambulatory Visit: Payer: Self-pay | Admitting: Physician Assistant

## 2022-06-26 NOTE — Telephone Encounter (Signed)
No.  We sent in tylenol 3 (10 days worth on 3/1)

## 2022-06-27 ENCOUNTER — Encounter: Payer: Self-pay | Admitting: Rehabilitative and Restorative Service Providers"

## 2022-06-27 ENCOUNTER — Telehealth: Payer: Self-pay | Admitting: Orthopaedic Surgery

## 2022-06-27 ENCOUNTER — Ambulatory Visit (INDEPENDENT_AMBULATORY_CARE_PROVIDER_SITE_OTHER): Payer: Commercial Managed Care - PPO | Admitting: Rehabilitative and Restorative Service Providers"

## 2022-06-27 ENCOUNTER — Encounter: Payer: Self-pay | Admitting: Orthopaedic Surgery

## 2022-06-27 ENCOUNTER — Other Ambulatory Visit: Payer: Self-pay | Admitting: Physician Assistant

## 2022-06-27 DIAGNOSIS — M25512 Pain in left shoulder: Secondary | ICD-10-CM | POA: Diagnosis not present

## 2022-06-27 DIAGNOSIS — R600 Localized edema: Secondary | ICD-10-CM | POA: Diagnosis not present

## 2022-06-27 DIAGNOSIS — M6281 Muscle weakness (generalized): Secondary | ICD-10-CM

## 2022-06-27 MED ORDER — TRAMADOL HCL 50 MG PO TABS: 50.0000 mg | ORAL_TABLET | Freq: Two times a day (BID) | ORAL | 0 refills | Status: DC

## 2022-06-27 NOTE — Therapy (Signed)
OUTPATIENT PHYSICAL THERAPY TREATMENT   Patient Name: Wesley Larson. MRN: [id number] DOB:[birth year], 47 y.o., male Today's Date: 06/27/2022  END OF SESSION:  PT End of Session - 06/27/22 0856     Visit Number 3    Number of Visits 15    Date for PT Re-Evaluation 09/06/22    PT Start Time 0851    PT Stop Time 0932    PT Time Calculation (min) 41 min    Activity Tolerance Patient limited by pain    Behavior During Therapy [facility] for tasks assessed/performed               Past Medical History:  Diagnosis Date   Asthma    Past Surgical History:  Procedure Laterality Date   SHOULDER ARTHROSCOPY WITH ROTATOR CUFF REPAIR AND SUBACROMIAL DECOMPRESSION Left 05/10/2022   Procedure: LEFT SHOULDER ARTHROSCOPY, ROTATOR CUFF REPAIR, EXTENSIVE DEBRIDEMENT, BICEPS TENODESIS AND SUBACROMIAL DECOMPRESSION;  Surgeon: Leandrew Koyanagi, MD;  Location: [place];  Service: Orthopedics;  Laterality: Left;   Patient Active Problem List   Diagnosis Date Noted   Impingement syndrome of left shoulder 03/14/2022   Nontraumatic complete tear of left rotator cuff 03/14/2022   Cervical radiculopathy 10/05/2021   Capsulitis of shoulder, left 04/19/2021   Tobacco use disorder    Polysubstance abuse ([place])    Altered mental status 09/11/2015   Altered level of consciousness    Hypothermia    Bradycardia    Drug abuse ([place])     PCP: no PCP  REFERRING PROVIDER: Leandrew Koyanagi, MD  REFERRING DIAG: M75.42 (ICD-10-CM) - Impingement syndrome of left shoulder M75.122 (ICD-10-CM) - Nontraumatic complete tear of left rotator cuff  THERAPY DIAG:  Acute pain of left shoulder  Muscle weakness (generalized)  Localized edema  Rationale for Evaluation and Treatment: Rehabilitation  ONSET DATE: S/p left rotator cuff repair, biceps tenodesis 05/10/2022  SUBJECTIVE:                                                                                                                                                                                       SUBJECTIVE STATEMENT: He indicated tightness in morning " with a little pain" upon arrival today.  Reported trying to get sling off some.  He reported feeling less complaints of increase after last visit compared to evaluation.   PERTINENT HISTORY: S/p left rotator cuff repair, biceps tenodesis 05/10/2022  PAIN:  NPRS scale: "a little pain at rest."  Still moderate to severe with movement at times.  Pain location: Lt shoulder Pain description: pressure Aggravating factors: being out of the sling, sleeping Relieving factors: sling,   PRECAUTIONS: Shoulder,  RTC protocol  WEIGHT BEARING RESTRICTIONS: RTC protocol   FALLS:  Has patient fallen in last 6 months? No  OCCUPATION: Architect work  PLOF: Independent  PATIENT [place] to Architect work, get back to working out  OBJECTIVE:  PATIENT SURVEYS:  06/14/2022 Eval: FOTO 4% functional intake  COGNITION: 06/14/2022 Overall cognitive status: Within functional limits for tasks assessed     SENSATION: 06/14/2022 WFL  POSTURE:   UPPER EXTREMITY ROM:   Passive ROM Right eval Left Eval in supine  Left 06/23/2022  Shoulder flexion (tested with table slide)  85 Supine PROM: 65 deg limited by pain and guarding   Shoulder extension     Shoulder abduction  65   Shoulder adduction     Shoulder internal rotation  60   Shoulder external rotation  20   Elbow flexion     Elbow extension     Wrist flexion     Wrist extension     Wrist ulnar deviation     Wrist radial deviation     Wrist pronation     Wrist supination     (Blank rows = not tested)  UPPER EXTREMITY MMT:  MMT Left Did not test at Eval due to post op precaution   Shoulder flexion    Shoulder extension    Shoulder abduction    Shoulder adduction    Shoulder internal rotation    Shoulder external rotation    Middle trapezius    Lower trapezius    Elbow flexion    Elbow extension    Wrist  flexion    Wrist extension    Wrist ulnar deviation    Wrist radial deviation    Wrist pronation    Wrist supination    Grip strength (lbs)    (Blank rows = not tested)  SHOULDER SPECIAL TESTS:   JOINT MOBILITY TESTING:    PALPATION:     TODAY'S TREATMENT:  06/27/2022  Therex Seated pulley AAROM to tolerance 3 mins flexion, scaption each Supine passive range Lt shoulder c Rt arm moving flexion x 15 (movement to tolerance)  Standing green band rows 2 x 10 Standing green band GH ext 2 x 10   Vasopnuematic 10 min, medium compression, 34 deg to Lt shoulder  06/23/2022 Manual: Lt shoulder supine passive range, g2 inferior mobs to tolerance (poor tolerance noted)  Therex Supine passive range Lt shoulder c Rt arm moving flexion 2 x 10 (movement to tolerance)  Seated table ER forward lean for Lt shoulder 5 sec hold x 10  Seated scapular retraction 5 sec hold x 10   Vasopnuematic 10 min, medium compression, 34 deg to Lt shoulder   06/14/2022 HEP creation and review with demonstration and trial set preformed, see below for details Left shoulder PROM gentle to tolerance, he has associated pain and muscle guarding wit hthis -Vasopnuematic device X 10 min, medium compression, 34 deg to Lt shoulder    PATIENT EDUCATION: 06/23/2022 Education details: HEP, PT plan of care Person educated: Patient Education method: Explanation, Demonstration, Verbal cues, and Handouts Education comprehension: verbalized understanding and needs further education   HOME EXERCISE PROGRAM: Access Code: [email or web address] URL: [email or web address] Date: 06/23/2022 Prepared by: Scot Jun  Exercises - Circular Shoulder Pendulum with Table Support  - 3 x daily - 7 x weekly - 1-2 sets - 10 reps - Seated Shoulder Abduction Towel Slide at Table Top  - 2 x daily - 6 x weekly - 1 sets - 10 reps - 5 sec hold -  Seated Shoulder Flexion Towel Slide at Table Top  - 2 x daily - 6 x weekly - 1 sets -  10 reps - 5 sec hold - Seated Shoulder External Rotation PROM on Table  - 2-3 x daily - 6 x weekly - 1 sets - 10 reps - 5 sec hold - Seated Gripping Towel  - 2 x daily - 6 x weekly - 1 sets - 20 reps - Seated Scapular Retraction  - 2 x daily - 6 x weekly - 1-2 sets - 10 reps - Supine Shoulder Flexion PROM  - 2-3 x daily - 7 x weekly - 1-2 sets - 10 reps - 5 hold  ASSESSMENT:  CLINICAL IMPRESSION: Pt continued to show impairment/symptoms c elevation attempts in passive and AAROM activity.  Pt did seem to continue to show improved tolerance with AAROM and PROM self applied vs any manual mobility interventions due to guarding.   OBJECTIVE IMPAIRMENTS: decreased activity tolerance, decreased shoulder mobility, decreased ROM, decreased strength, impaired flexibility, impaired UE use,, and pain.  ACTIVITY LIMITATIONS: reaching, lifting, carry,  cleaning, driving, and or occupation  PERSONAL FACTORS: post op status also affecting patient's functional outcome.  REHAB POTENTIAL: Good  CLINICAL DECISION MAKING: Stable/uncomplicated  EVALUATION COMPLEXITY: Low    GOALS: Short term PT Goals Target date: 07/12/2022   Pt will be I and compliant with HEP. Baseline:  Goal status: on going 06/23/2022 Pt will decrease pain by 25% overall Baseline: Goal status: on going 06/23/2022  Long term PT goals Target date:09/06/2022   Pt will improve Rt shoulder AROM to WNL to improve functional reaching Baseline: Goal status: New Pt will improve  Rt shoulder strength to at least 5-/5 MMT to improve functional strength Baseline: Goal status: New Pt will improve FOTO to at least 56% functional to show improved function Baseline: Goal status: New Pt will reduce pain to overall less than 3/10 with usual activity and work activity. Baseline: Goal status: New  PLAN: PT FREQUENCY: 1-2 times per week   PT DURATION: 6-12 weeks  PLANNED INTERVENTIONS (unless contraindicated): aquatic PT, Canalith  repositioning, cryotherapy, Electrical stimulation, Iontophoresis with 4 mg/ml dexamethasome, Moist heat, traction, Ultrasound, gait training, Therapeutic exercise, balance training, neuromuscular re-education, patient/family education, prosthetic training, manual techniques, passive ROM, dry needling, taping, vasopnuematic device, vestibular, spinal manipulations, joint manipulations  PLAN FOR NEXT SESSION: Progressive PROM/AAROM improvements to tolerance.   no AROM or biceps resistance until 6 weeks post op July 07, 2022. (Has passed now)   Scot Jun, PT, DPT, OCS, ATC 06/27/22  9:31 AM

## 2022-06-27 NOTE — Telephone Encounter (Signed)
Notified patient via MyChart

## 2022-06-27 NOTE — Telephone Encounter (Signed)
sent 

## 2022-06-27 NOTE — Telephone Encounter (Signed)
Patient called asked if his Rx can be sent to [facility] on [address] (Tramadol)     The number to contact patient is [phone]

## 2022-06-27 NOTE — Telephone Encounter (Signed)
Sent.  Can you please let patient know that when he calls for a refill he needs to let us know at that point what pharmacy he wants this to be sent to.  We cannot keep changing multiple times every time he calls for a refill

## 2022-06-27 NOTE — Telephone Encounter (Signed)
Gotcha.  Sent in

## 2022-06-28 NOTE — Telephone Encounter (Signed)
thnx

## 2022-06-29 ENCOUNTER — Encounter: Payer: Self-pay | Admitting: Orthopaedic Surgery

## 2022-06-30 ENCOUNTER — Ambulatory Visit (INDEPENDENT_AMBULATORY_CARE_PROVIDER_SITE_OTHER): Payer: Commercial Managed Care - PPO | Admitting: Rehabilitative and Restorative Service Providers"

## 2022-06-30 ENCOUNTER — Encounter: Payer: Self-pay | Admitting: Rehabilitative and Restorative Service Providers"

## 2022-06-30 DIAGNOSIS — R600 Localized edema: Secondary | ICD-10-CM

## 2022-06-30 DIAGNOSIS — M25512 Pain in left shoulder: Secondary | ICD-10-CM | POA: Diagnosis not present

## 2022-06-30 DIAGNOSIS — M6281 Muscle weakness (generalized): Secondary | ICD-10-CM | POA: Diagnosis not present

## 2022-06-30 NOTE — Therapy (Signed)
OUTPATIENT PHYSICAL THERAPY TREATMENT   Patient Name: Wesley Larson. MRN: [id number] DOB:[birth year], 47 y.o., male Today's Date: 06/30/2022  END OF SESSION:  PT End of Session - 06/30/22 0939     Visit Number 4    Number of Visits 15    Date for PT Re-Evaluation 09/06/22    PT Start Time 0930    PT Stop Time 1008    PT Time Calculation (min) 38 min    Activity Tolerance Patient limited by pain    Behavior During Therapy [facility] for tasks assessed/performed                Past Medical History:  Diagnosis Date   Asthma    Past Surgical History:  Procedure Laterality Date   SHOULDER ARTHROSCOPY WITH ROTATOR CUFF REPAIR AND SUBACROMIAL DECOMPRESSION Left 05/10/2022   Procedure: LEFT SHOULDER ARTHROSCOPY, ROTATOR CUFF REPAIR, EXTENSIVE DEBRIDEMENT, BICEPS TENODESIS AND SUBACROMIAL DECOMPRESSION;  Surgeon: Leandrew Koyanagi, MD;  Location: [facility];  Service: Orthopedics;  Laterality: Left;   Patient Active Problem List   Diagnosis Date Noted   Impingement syndrome of left shoulder 03/14/2022   Nontraumatic complete tear of left rotator cuff 03/14/2022   Cervical radiculopathy 10/05/2021   Capsulitis of shoulder, left 04/19/2021   Tobacco use disorder    Polysubstance abuse ([place])    Altered mental status 09/11/2015   Altered level of consciousness    Hypothermia    Bradycardia    Drug abuse ([place])     PCP: no PCP  REFERRING PROVIDER: Leandrew Koyanagi, MD  REFERRING DIAG: M75.42 (ICD-10-CM) - Impingement syndrome of left shoulder M75.122 (ICD-10-CM) - Nontraumatic complete tear of left rotator cuff  THERAPY DIAG:  Acute pain of left shoulder  Muscle weakness (generalized)  Localized edema  Rationale for Evaluation and Treatment: Rehabilitation  ONSET DATE: S/p left rotator cuff repair, biceps tenodesis 05/10/2022  SUBJECTIVE:                                                                                                                                                                                       SUBJECTIVE STATEMENT: He indicated no pain rest.    PERTINENT HISTORY: S/p left rotator cuff repair, biceps tenodesis 05/10/2022  PAIN:  NPRS scale: at worst 4-5/10 Pain location: Lt shoulder Pain description: pressure Aggravating factors: being out of the sling, sleeping Relieving factors: sling,   PRECAUTIONS: Shoulder, RTC protocol  WEIGHT BEARING RESTRICTIONS: RTC protocol   FALLS:  Has patient fallen in last 6 months? No  OCCUPATION: Architect work  PLOF: Independent  PATIENT GOALS:return to Architect work, get back to working out  OBJECTIVE:  PATIENT SURVEYS:  06/14/2022 Eval: FOTO 4% functional intake  COGNITION: 06/14/2022 Overall cognitive status: Within functional limits for tasks assessed     SENSATION: 06/14/2022 [facility]  POSTURE:   UPPER EXTREMITY ROM:   Passive ROM Left Eval in supine  Left 06/23/2022 Left 06/30/2022  Shoulder flexion (tested with table slide) 85 Supine PROM: 65 deg limited by pain and guarding  AAROM with wand in supine:  130  Shoulder extension     Shoulder abduction 65    Shoulder adduction     Shoulder internal rotation 60    Shoulder external rotation 20    Elbow flexion     Elbow extension     Wrist flexion     Wrist extension     Wrist ulnar deviation     Wrist radial deviation     Wrist pronation     Wrist supination     (Blank rows = not tested)  UPPER EXTREMITY MMT:  MMT Left Did not test at Eval due to post op precaution   Shoulder flexion    Shoulder extension    Shoulder abduction    Shoulder adduction    Shoulder internal rotation    Shoulder external rotation    Middle trapezius    Lower trapezius    Elbow flexion    Elbow extension    Wrist flexion    Wrist extension    Wrist ulnar deviation    Wrist radial deviation    Wrist pronation    Wrist supination    Grip strength (lbs)    (Blank rows = not tested)  SHOULDER SPECIAL  TESTS:   JOINT MOBILITY TESTING:    PALPATION:     TODAY'S TREATMENT:  06/30/2022 Therex UBE AAROM UE only lvl 1.0 3 mins each way with 2 min rest Seated pulley AAROM to tolerance 3 mins flexion, scaption each Supine AAROM with 1 lb bar c Rt hand closer to Lt hand (towards midline of body) 2 x 10   Vasopnuematic 10 min, medium compression, 34 deg to Lt shoulder  06/27/2022  Therex Seated pulley AAROM to tolerance 3 mins flexion, scaption each Supine passive range Lt shoulder c Rt arm moving flexion x 15 (movement to tolerance)  Standing green band rows 2 x 10 Standing green band GH ext 2 x 10   Vasopnuematic 10 min, medium compression, 34 deg to Lt shoulder  06/23/2022 Manual: Lt shoulder supine passive range, g2 inferior mobs to tolerance (poor tolerance noted)  Therex Supine passive range Lt shoulder c Rt arm moving flexion 2 x 10 (movement to tolerance)  Seated table ER forward lean for Lt shoulder 5 sec hold x 10  Seated scapular retraction 5 sec hold x 10   Vasopnuematic 10 min, medium compression, 34 deg to Lt shoulder     PATIENT EDUCATION: 06/23/2022 Education details: HEP, PT plan of care Person educated: Patient Education method: Explanation, Demonstration, Verbal cues, and Handouts Education comprehension: verbalized understanding and needs further education   HOME EXERCISE PROGRAM: Access Code: [email or web address] URL: [email or web address] Date: 06/23/2022 Prepared by: Scot Jun  Exercises - Circular Shoulder Pendulum with Table Support  - 3 x daily - 7 x weekly - 1-2 sets - 10 reps - Seated Shoulder Abduction Towel Slide at Table Top  - 2 x daily - 6 x weekly - 1 sets - 10 reps - 5 sec hold - Seated Shoulder Flexion Towel Slide at Table Top  - 2 x daily - 6 x weekly -  1 sets - 10 reps - 5 sec hold - Seated Shoulder External Rotation PROM on Table  - 2-3 x daily - 6 x weekly - 1 sets - 10 reps - 5 sec hold - Seated Gripping Towel  - 2 x  daily - 6 x weekly - 1 sets - 20 reps - Seated Scapular Retraction  - 2 x daily - 6 x weekly - 1-2 sets - 10 reps - Supine Shoulder Flexion PROM  - 2-3 x daily - 7 x weekly - 1-2 sets - 10 reps - 5 hold  ASSESSMENT:  CLINICAL IMPRESSION:  Continued higher severity and irritability of symptom presentation.  Improvement in AAROM mobility was observed in supine and pulley exercises today.   OBJECTIVE IMPAIRMENTS: decreased activity tolerance, decreased shoulder mobility, decreased ROM, decreased strength, impaired flexibility, impaired UE use,, and pain.  ACTIVITY LIMITATIONS: reaching, lifting, carry,  cleaning, driving, and or occupation  PERSONAL FACTORS: post op status also affecting patient's functional outcome.  REHAB POTENTIAL: Good  CLINICAL DECISION MAKING: Stable/uncomplicated  EVALUATION COMPLEXITY: Low    GOALS: Short term PT Goals Target date: 07/12/2022   Pt will be I and compliant with HEP. Baseline:  Goal status: on going 06/23/2022 Pt will decrease pain by 25% overall Baseline: Goal status: on going 06/23/2022  Long term PT goals Target date:09/06/2022   Pt will improve Rt shoulder AROM to WNL to improve functional reaching Baseline: Goal status: New Pt will improve  Rt shoulder strength to at least 5-/5 MMT to improve functional strength Baseline: Goal status: New Pt will improve FOTO to at least 56% functional to show improved function Baseline: Goal status: New Pt will reduce pain to overall less than 3/10 with usual activity and work activity. Baseline: Goal status: New  PLAN: PT FREQUENCY: 1-2 times per week   PT DURATION: 6-12 weeks  PLANNED INTERVENTIONS (unless contraindicated): aquatic PT, Canalith repositioning, cryotherapy, Electrical stimulation, Iontophoresis with 4 mg/ml dexamethasome, Moist heat, traction, Ultrasound, gait training, Therapeutic exercise, balance training, neuromuscular re-education, patient/family education, prosthetic  training, manual techniques, passive ROM, dry needling, taping, vasopnuematic device, vestibular, spinal manipulations, joint manipulations  PLAN FOR NEXT SESSION: Continue AAROM improvements as able.  Early isometrics.   no AROM or biceps resistance until 6 weeks post op Jul 05, 2022. (Has passed this now)   Scot Jun, PT, DPT, OCS, ATC 06/30/22  10:02 AM

## 2022-07-03 ENCOUNTER — Encounter: Payer: Self-pay | Admitting: Orthopaedic Surgery

## 2022-07-03 NOTE — Therapy (Unsigned)
OUTPATIENT PHYSICAL THERAPY TREATMENT   Patient Name: Wesley Larson. MRN: [id number] DOB:05/14/1975, 47 y.o., male Today's Date: 07/04/2022  END OF SESSION:  PT End of Session - 07/04/22 0856     Visit Number 5    Number of Visits 15    Date for PT Re-Evaluation 09/06/22    PT Start Time 0848    PT Stop Time 0932    PT Time Calculation (min) 44 min    Activity Tolerance Patient limited by pain    Behavior During Therapy [facility] for tasks assessed/performed                 Past Medical History:  Diagnosis Date   Asthma    Past Surgical History:  Procedure Laterality Date   SHOULDER ARTHROSCOPY WITH ROTATOR CUFF REPAIR AND SUBACROMIAL DECOMPRESSION Left 05/10/2022   Procedure: LEFT SHOULDER ARTHROSCOPY, ROTATOR CUFF REPAIR, EXTENSIVE DEBRIDEMENT, BICEPS TENODESIS AND SUBACROMIAL DECOMPRESSION;  Surgeon: Leandrew Koyanagi, MD;  Location: [facility];  Service: Orthopedics;  Laterality: Left;   Patient Active Problem List   Diagnosis Date Noted   Impingement syndrome of left shoulder 03/14/2022   Nontraumatic complete tear of left rotator cuff 03/14/2022   Cervical radiculopathy 10/05/2021   Capsulitis of shoulder, left 04/19/2021   Tobacco use disorder    Polysubstance abuse ([place])    Altered mental status 09/11/2015   Altered level of consciousness    Hypothermia    Bradycardia    Drug abuse ([place])     PCP: no PCP  REFERRING PROVIDER: Leandrew Koyanagi, MD  REFERRING DIAG: M75.42 (ICD-10-CM) - Impingement syndrome of left shoulder M75.122 (ICD-10-CM) - Nontraumatic complete tear of left rotator cuff  THERAPY DIAG:  Acute pain of left shoulder  Muscle weakness (generalized)  Localized edema  Rationale for Evaluation and Treatment: Rehabilitation  ONSET DATE: S/p left rotator cuff repair, biceps tenodesis 05/10/2022  SUBJECTIVE:                                                                                                                                                                                       SUBJECTIVE STATEMENT: Pt states that his shoulder is a 4/10 today. He has been in more pain the past few days. He reports possibly rolling onto his shoulder in his sleep.   PERTINENT HISTORY: S/p left rotator cuff repair, biceps tenodesis 05/10/2022  PAIN:  NPRS scale: at worst 4-5/10 Pain location: Lt shoulder Pain description: pressure Aggravating factors: being out of the sling, sleeping Relieving factors: sling,   PRECAUTIONS: Shoulder, RTC protocol  WEIGHT BEARING RESTRICTIONS: RTC protocol   FALLS:  Has patient fallen in  last 6 months? No  OCCUPATION: Architect work  PLOF: Independent  PATIENT [place] to Architect work, get back to working out  OBJECTIVE:  PATIENT SURVEYS:  06/14/2022 Eval: FOTO 4% functional intake  COGNITION: 06/14/2022 Overall cognitive status: Within functional limits for tasks assessed     SENSATION: 06/14/2022 WFL  POSTURE:   UPPER EXTREMITY ROM:   Passive ROM Left Eval in supine  Left 06/23/2022 Left 06/30/2022  Shoulder flexion (tested with table slide) 85 Supine PROM: 65 deg limited by pain and guarding  AAROM with wand in supine:  130  Shoulder extension     Shoulder abduction 65    Shoulder adduction     Shoulder internal rotation 60    Shoulder external rotation 20    Elbow flexion     Elbow extension     Wrist flexion     Wrist extension     Wrist ulnar deviation     Wrist radial deviation     Wrist pronation     Wrist supination     (Blank rows = not tested)  UPPER EXTREMITY MMT:  MMT Left Did not test at Eval due to post op precaution   Shoulder flexion    Shoulder extension    Shoulder abduction    Shoulder adduction    Shoulder internal rotation    Shoulder external rotation    Middle trapezius    Lower trapezius    Elbow flexion    Elbow extension    Wrist flexion    Wrist extension    Wrist ulnar deviation    Wrist radial  deviation    Wrist pronation    Wrist supination    Grip strength (lbs)    (Blank rows = not tested)  SHOULDER SPECIAL TESTS:   JOINT MOBILITY TESTING:    PALPATION:     TODAY'S TREATMENT:  07/04/2022 Therex UBE AAROM UE only lvl 1.0 3 mins each way with 2 min rest Seated pulley AAROM to tolerance 3 mins flexion, scaption each Supine AAROM with 2 lb bar c Rt hand closer to Lt hand (towards midline of body) 2 x 10  Supine chest press with 2 lb bar x20  Standing red band rows 2 x 10 Standing red band GH ext 2 x 10   Vasopnuematic 10 min, medium compression, 34 deg to Lt shoulder  06/30/2022 Therex UBE AAROM UE only lvl 1.0 3 mins each way with 2 min rest Seated pulley AAROM to tolerance 3 mins flexion, scaption each Supine AAROM with 1 lb bar c Rt hand closer to Lt hand (towards midline of body) 2 x 10   Vasopnuematic 10 min, medium compression, 34 deg to Lt shoulder  06/27/2022  Therex Seated pulley AAROM to tolerance 3 mins flexion, scaption each Supine passive range Lt shoulder c Rt arm moving flexion x 15 (movement to tolerance)  Standing green band rows 2 x 10 Standing green band GH ext 2 x 10   Vasopnuematic 10 min, medium compression, 34 deg to Lt shoulder  06/23/2022 Manual: Lt shoulder supine passive range, g2 inferior mobs to tolerance (poor tolerance noted)  Therex Supine passive range Lt shoulder c Rt arm moving flexion 2 x 10 (movement to tolerance)  Seated table ER forward lean for Lt shoulder 5 sec hold x 10  Seated scapular retraction 5 sec hold x 10   Vasopnuematic 10 min, medium compression, 34 deg to Lt shoulder     PATIENT EDUCATION: 06/23/2022 Education details: HEP, PT  plan of care Person educated: Patient Education method: Explanation, Demonstration, Verbal cues, and Handouts Education comprehension: verbalized understanding and needs further education   HOME EXERCISE PROGRAM: Access Code: [email or web address] URL:  [email or web address] Date: 06/23/2022 Prepared by: Scot Jun  Exercises - Circular Shoulder Pendulum with Table Support  - 3 x daily - 7 x weekly - 1-2 sets - 10 reps - Seated Shoulder Abduction Towel Slide at Table Top  - 2 x daily - 6 x weekly - 1 sets - 10 reps - 5 sec hold - Seated Shoulder Flexion Towel Slide at Table Top  - 2 x daily - 6 x weekly - 1 sets - 10 reps - 5 sec hold - Seated Shoulder External Rotation PROM on Table  - 2-3 x daily - 6 x weekly - 1 sets - 10 reps - 5 sec hold - Seated Gripping Towel  - 2 x daily - 6 x weekly - 1 sets - 20 reps - Seated Scapular Retraction  - 2 x daily - 6 x weekly - 1-2 sets - 10 reps - Supine Shoulder Flexion PROM  - 2-3 x daily - 7 x weekly - 1-2 sets - 10 reps - 5 hold  ASSESSMENT:  CLINICAL IMPRESSION:  Continued higher severity and irritability of symptom presentation, but pt was able to tolerate light resistance strengthening today. Improvement in AAROM mobility was observed in supine and pulley exercises today. Encouraged disuse of pillow and weaning off of sling at this time. Pt continues to benefit from vaso at the end of session to control swelling and pain.   OBJECTIVE IMPAIRMENTS: decreased activity tolerance, decreased shoulder mobility, decreased ROM, decreased strength, impaired flexibility, impaired UE use,, and pain.  ACTIVITY LIMITATIONS: reaching, lifting, carry,  cleaning, driving, and or occupation  PERSONAL FACTORS: post op status also affecting patient's functional outcome.  REHAB POTENTIAL: Good  CLINICAL DECISION MAKING: Stable/uncomplicated  EVALUATION COMPLEXITY: Low    GOALS: Short term PT Goals Target date: 07/12/2022   Pt will be I and compliant with HEP. Baseline:  Goal status: on going 06/23/2022 Pt will decrease pain by 25% overall Baseline: Goal status: on going 06/23/2022  Long term PT goals Target date:09/06/2022   Pt will improve Rt shoulder AROM to WNL to improve  functional reaching Baseline: Goal status: New Pt will improve  Rt shoulder strength to at least 5-/5 MMT to improve functional strength Baseline: Goal status: New Pt will improve FOTO to at least 56% functional to show improved function Baseline: Goal status: New Pt will reduce pain to overall less than 3/10 with usual activity and work activity. Baseline: Goal status: New  PLAN: PT FREQUENCY: 1-2 times per week   PT DURATION: 6-12 weeks  PLANNED INTERVENTIONS (unless contraindicated): aquatic PT, Canalith repositioning, cryotherapy, Electrical stimulation, Iontophoresis with 4 mg/ml dexamethasome, Moist heat, traction, Ultrasound, gait training, Therapeutic exercise, balance training, neuromuscular re-education, patient/family education, prosthetic training, manual techniques, passive ROM, dry needling, taping, vasopnuematic device, vestibular, spinal manipulations, joint manipulations  PLAN FOR NEXT SESSION: Continue AAROM improvements as able.  Early isometrics.   no AROM or biceps resistance until 6 weeks post op 2022/07/04. (Has passed this now)   Rudi Heap PT, DPT 07/04/22  10:23 AM

## 2022-07-04 ENCOUNTER — Encounter: Payer: Self-pay | Admitting: Physical Therapy

## 2022-07-04 ENCOUNTER — Other Ambulatory Visit: Payer: Self-pay | Admitting: Physician Assistant

## 2022-07-04 ENCOUNTER — Ambulatory Visit (INDEPENDENT_AMBULATORY_CARE_PROVIDER_SITE_OTHER): Payer: Commercial Managed Care - PPO | Admitting: Physical Therapy

## 2022-07-04 DIAGNOSIS — M25512 Pain in left shoulder: Secondary | ICD-10-CM

## 2022-07-04 DIAGNOSIS — M6281 Muscle weakness (generalized): Secondary | ICD-10-CM

## 2022-07-04 DIAGNOSIS — R600 Localized edema: Secondary | ICD-10-CM | POA: Diagnosis not present

## 2022-07-04 MED ORDER — HYDROCODONE-ACETAMINOPHEN 5-325 MG PO TABS: 1.0000 | ORAL_TABLET | Freq: Every day | ORAL | 0 refills | Status: DC | PRN

## 2022-07-04 NOTE — Telephone Encounter (Signed)
I sent in one last rx for norco that cannot be refilled

## 2022-07-06 ENCOUNTER — Encounter: Payer: Self-pay | Admitting: Rehabilitative and Restorative Service Providers"

## 2022-07-06 ENCOUNTER — Ambulatory Visit (INDEPENDENT_AMBULATORY_CARE_PROVIDER_SITE_OTHER): Payer: Commercial Managed Care - PPO | Admitting: Rehabilitative and Restorative Service Providers"

## 2022-07-06 DIAGNOSIS — M6281 Muscle weakness (generalized): Secondary | ICD-10-CM

## 2022-07-06 DIAGNOSIS — R600 Localized edema: Secondary | ICD-10-CM

## 2022-07-06 DIAGNOSIS — M25512 Pain in left shoulder: Secondary | ICD-10-CM

## 2022-07-06 NOTE — Therapy (Signed)
OUTPATIENT PHYSICAL THERAPY TREATMENT   Patient Name: Wesley Larson. MRN: [id number] DOB:May 31, 1975, 47 y.o., male Today's Date: 07/06/2022  END OF SESSION:  PT End of Session - 07/06/22 0853     Visit Number 6    Number of Visits 15    Date for PT Re-Evaluation 09/06/22    PT Start Time 0848    PT Stop Time 0938    PT Time Calculation (min) 50 min    Activity Tolerance Patient limited by pain    Behavior During Therapy [facility] for tasks assessed/performed                  Past Medical History:  Diagnosis Date   Asthma    Past Surgical History:  Procedure Laterality Date   SHOULDER ARTHROSCOPY WITH ROTATOR CUFF REPAIR AND SUBACROMIAL DECOMPRESSION Left 05/10/2022   Procedure: LEFT SHOULDER ARTHROSCOPY, ROTATOR CUFF REPAIR, EXTENSIVE DEBRIDEMENT, BICEPS TENODESIS AND SUBACROMIAL DECOMPRESSION;  Surgeon: Leandrew Koyanagi, MD;  Location: [place];  Service: Orthopedics;  Laterality: Left;   Patient Active Problem List   Diagnosis Date Noted   Impingement syndrome of left shoulder 03/14/2022   Nontraumatic complete tear of left rotator cuff 03/14/2022   Cervical radiculopathy 10/05/2021   Capsulitis of shoulder, left 04/19/2021   Tobacco use disorder    Polysubstance abuse ([place])    Altered mental status 09/11/2015   Altered level of consciousness    Hypothermia    Bradycardia    Drug abuse ([place])     PCP: no PCP  REFERRING PROVIDER: Leandrew Koyanagi, MD  REFERRING DIAG: M75.42 (ICD-10-CM) - Impingement syndrome of left shoulder M75.122 (ICD-10-CM) - Nontraumatic complete tear of left rotator cuff  THERAPY DIAG:  Acute pain of left shoulder  Muscle weakness (generalized)  Localized edema  Rationale for Evaluation and Treatment: Rehabilitation  ONSET DATE: S/p left rotator cuff repair, biceps tenodesis 05/10/2022  SUBJECTIVE:                                                                                                                                                                                       SUBJECTIVE STATEMENT: He indicated having some increase in symptoms today compared to previous visits.  He indicated he moved his arm and had a sharp pain (not during exercise) that caused some trouble but has reduced some.  He indicated getting pulleys at home delivered.   PERTINENT HISTORY: S/p left rotator cuff repair, biceps tenodesis 05/10/2022  PAIN:  NPRS scale: 7/10 Pain location: Lt shoulder Pain description: pressure Aggravating factors: end range movements, quick movements.  Relieving factors: sling,   PRECAUTIONS: Shoulder, RTC protocol  WEIGHT BEARING RESTRICTIONS: RTC protocol   FALLS:  Has patient fallen in last 6 months? No  OCCUPATION: Architect work  PLOF: Independent  PATIENT [place] to Architect work, get back to working out  OBJECTIVE:  PATIENT SURVEYS:  06/14/2022 Eval: FOTO 4% functional intake  COGNITION: 06/14/2022 Overall cognitive status: Within functional limits for tasks assessed     SENSATION: 06/14/2022 WFL  POSTURE:   UPPER EXTREMITY ROM:   Passive ROM Left Eval in supine  Left 06/23/2022 Left 06/30/2022 Left 07/06/2022  Shoulder flexion (tested with table slide) 85 Supine PROM: 65 deg limited by pain and guarding  AAROM with wand in supine:  130 AAROM with wand in supine 135  AROM in supine 125  Shoulder extension      Shoulder abduction 65     Shoulder adduction      Shoulder internal rotation 60     Shoulder external rotation 20     Elbow flexion      Elbow extension      Wrist flexion      Wrist extension      Wrist ulnar deviation      Wrist radial deviation      Wrist pronation      Wrist supination      (Blank rows = not tested)  UPPER EXTREMITY MMT:  MMT Left Did not test at Eval due to post op precaution   Shoulder flexion    Shoulder extension    Shoulder abduction    Shoulder adduction    Shoulder internal rotation    Shoulder  external rotation    Middle trapezius    Lower trapezius    Elbow flexion    Elbow extension    Wrist flexion    Wrist extension    Wrist ulnar deviation    Wrist radial deviation    Wrist pronation    Wrist supination    Grip strength (lbs)    (Blank rows = not tested)  SHOULDER SPECIAL TESTS:   JOINT MOBILITY TESTING:    PALPATION:     TODAY'S TREATMENT:  07/06/2022 Therex UBE AAROM UE only lvl 1.5 3 mins each way Standing green band rows bilateral 2 x 15 Standing green band GH ext bilateral 2 x 15  Supine AAROM with 1 lb bar 2 x 10  Supine AROM in supine x 10  Supine wand ER c towel under arm 5 sec hold x 10  Standing painfree isometrics Lt arm at doorframe 5 sec on/off x 12 for ER and abduction c arm at side.   Adjusted HEP to reflect improvements in activity.  See HEP below for handout update.   Vasopnuematic 10 min, medium compression, 34 deg to Lt shoulder  07/04/2022 Therex UBE AAROM UE only lvl 1.0 3 mins each way with 2 min rest Seated pulley AAROM to tolerance 3 mins flexion, scaption each Supine AAROM with 2 lb bar c Rt hand closer to Lt hand (towards midline of body) 2 x 10  Supine chest press with 2 lb bar x20  Standing red band rows 2 x 10 Standing red band GH ext 2 x 10   Vasopnuematic 10 min, medium compression, 34 deg to Lt shoulder  06/30/2022 Therex UBE AAROM UE only lvl 1.0 3 mins each way with 2 min rest Seated pulley AAROM to tolerance 3 mins flexion, scaption each Supine AAROM with 1 lb bar c Rt hand closer to Lt hand (towards midline of body) 2 x 10  Vasopnuematic 10 min, medium compression, 34 deg to Lt shoulder  06/27/2022  Therex Seated pulley AAROM to tolerance 3 mins flexion, scaption each Supine passive range Lt shoulder c Rt arm moving flexion x 15 (movement to tolerance)  Standing green band rows 2 x 10 Standing green band GH ext 2 x 10   Vasopnuematic 10 min, medium compression, 34 deg to Lt  shoulder  06/23/2022 Manual: Lt shoulder supine passive range, g2 inferior mobs to tolerance (poor tolerance noted)  Therex Supine passive range Lt shoulder c Rt arm moving flexion 2 x 10 (movement to tolerance)  Seated table ER forward lean for Lt shoulder 5 sec hold x 10  Seated scapular retraction 5 sec hold x 10   Vasopnuematic 10 min, medium compression, 34 deg to Lt shoulder   PATIENT EDUCATION: 06/23/2022 Education details: HEP, PT plan of care Person educated: Patient Education method: Explanation, Demonstration, Verbal cues, and Handouts Education comprehension: verbalized understanding and needs further education   HOME EXERCISE PROGRAM: Access Code: [email or web address] URL: [email or web address] Date: 07/06/2022 Prepared by: Scot Jun  Exercises - Seated Scapular Retraction  - 2-3 x daily - 7 x weekly - 1 sets - 10 reps - Supine Shoulder External Rotation with Dowel at 20 Degrees of Abduction (Mirrored)  - 2-3 x daily - 7 x weekly - 1 sets - 10 reps - 5 hold - Supine Shoulder Flexion Extension AAROM with Dowel  - 2-3 x daily - 7 x weekly - 1-2 sets - 10-15 reps - 3 hold - Supine Shoulder Flexion Extension Full Range AROM  - 1-2 x daily - 7 x weekly - 1-2 sets - 10-15 reps - Standing Isometric Shoulder External Rotation with Doorway  - 1-2 x daily - 7 x weekly - 1 sets - 10 reps - 5 hold - Standing Isometric Shoulder Abduction with Doorway - Arm Bent  - 1-2 x daily - 7 x weekly - 1 sets - 10 reps - 5 hold  ASSESSMENT:  CLINICAL IMPRESSION:  Adjusted HEP to reflect improvements and include combination of AAROM/AROM/isometrics for continued gain.  Pain in movements still noted and end range but progressing.   OBJECTIVE IMPAIRMENTS: decreased activity tolerance, decreased shoulder mobility, decreased ROM, decreased strength, impaired flexibility, impaired UE use,, and pain.  ACTIVITY LIMITATIONS: reaching, lifting, carry,  cleaning, driving, and or  occupation  PERSONAL FACTORS: post op status also affecting patient's functional outcome.  REHAB POTENTIAL: Good  CLINICAL DECISION MAKING: Stable/uncomplicated  EVALUATION COMPLEXITY: Low    GOALS: Short term PT Goals Target date: 07/12/2022   Pt will be I and compliant with HEP. Baseline:  Goal status: Met Pt will decrease pain by 25% overall Baseline: Goal status: on going 07/06/2022  Long term PT goals Target date:09/06/2022   Pt will improve Rt shoulder AROM to WNL to improve functional reaching Baseline: Goal status: on going 07/06/2022 Pt will improve  Rt shoulder strength to at least 5-/5 MMT to improve functional strength Baseline: Goal status: on going 07/06/2022 Pt will improve FOTO to at least 56% functional to show improved function Baseline: Goal status: on going 07/06/2022 Pt will reduce pain to overall less than 3/10 with usual activity and work activity. Baseline: Goal status: on going 07/06/2022  PLAN: PT FREQUENCY: 1-2 times per week   PT DURATION: 6-12 weeks  PLANNED INTERVENTIONS (unless contraindicated): aquatic PT, Canalith repositioning, cryotherapy, Electrical stimulation, Iontophoresis with 4 mg/ml dexamethasome, Moist heat, traction, Ultrasound, gait training, Therapeutic exercise, balance training, neuromuscular re-education, patient/family education,  prosthetic training, manual techniques, passive ROM, dry needling, taping, vasopnuematic device, vestibular, spinal manipulations, joint manipulations  PLAN FOR NEXT SESSION:  AAROM to AROM in gravity reduced positioning as tolerated.    no AROM or biceps resistance until 6 weeks post op 2022-07-17. (Has passed this now)   Scot Jun, PT, DPT, OCS, ATC 07/06/22  9:29 AM

## 2022-07-10 ENCOUNTER — Encounter: Payer: Self-pay | Admitting: Orthopaedic Surgery

## 2022-07-10 ENCOUNTER — Other Ambulatory Visit: Payer: Self-pay | Admitting: Physician Assistant

## 2022-07-10 MED ORDER — ACETAMINOPHEN-CODEINE 300-30 MG PO TABS: 1.0000 | ORAL_TABLET | Freq: Two times a day (BID) | ORAL | 0 refills | Status: DC | PRN

## 2022-07-10 NOTE — Telephone Encounter (Signed)
Sent in tylenol 3

## 2022-07-11 ENCOUNTER — Encounter: Payer: Self-pay | Admitting: Physical Therapy

## 2022-07-11 ENCOUNTER — Ambulatory Visit (INDEPENDENT_AMBULATORY_CARE_PROVIDER_SITE_OTHER): Payer: Commercial Managed Care - PPO | Admitting: Physical Therapy

## 2022-07-11 DIAGNOSIS — M6281 Muscle weakness (generalized): Secondary | ICD-10-CM | POA: Diagnosis not present

## 2022-07-11 DIAGNOSIS — R600 Localized edema: Secondary | ICD-10-CM | POA: Diagnosis not present

## 2022-07-11 DIAGNOSIS — M25512 Pain in left shoulder: Secondary | ICD-10-CM

## 2022-07-11 NOTE — Therapy (Signed)
OUTPATIENT PHYSICAL THERAPY TREATMENT   Patient Name: Wesley Larson. MRN: [id number] DOB:Dec 03, 1975, 47 y.o., male Today's Date: 07/11/2022  END OF SESSION:  PT End of Session - 07/11/22 0854     Visit Number 7    Number of Visits 15    Date for PT Re-Evaluation 09/06/22    PT Start Time 0848    PT Stop Time 0936    PT Time Calculation (min) 48 min    Activity Tolerance Patient limited by pain    Behavior During Therapy [facility] for tasks assessed/performed                  Past Medical History:  Diagnosis Date   Asthma    Past Surgical History:  Procedure Laterality Date   SHOULDER ARTHROSCOPY WITH ROTATOR CUFF REPAIR AND SUBACROMIAL DECOMPRESSION Left 05/10/2022   Procedure: LEFT SHOULDER ARTHROSCOPY, ROTATOR CUFF REPAIR, EXTENSIVE DEBRIDEMENT, BICEPS TENODESIS AND SUBACROMIAL DECOMPRESSION;  Surgeon: Leandrew Koyanagi, MD;  Location: [place];  Service: Orthopedics;  Laterality: Left;   Patient Active Problem List   Diagnosis Date Noted   Impingement syndrome of left shoulder 03/14/2022   Nontraumatic complete tear of left rotator cuff 03/14/2022   Cervical radiculopathy 10/05/2021   Capsulitis of shoulder, left 04/19/2021   Tobacco use disorder    Polysubstance abuse ([place])    Altered mental status 09/11/2015   Altered level of consciousness    Hypothermia    Bradycardia    Drug abuse ([place])     PCP: no PCP  REFERRING PROVIDER: Leandrew Koyanagi, MD  REFERRING DIAG: M75.42 (ICD-10-CM) - Impingement syndrome of left shoulder M75.122 (ICD-10-CM) - Nontraumatic complete tear of left rotator cuff  THERAPY DIAG:  Acute pain of left shoulder  Muscle weakness (generalized)  Localized edema  Rationale for Evaluation and Treatment: Rehabilitation  ONSET DATE: S/p left rotator cuff repair, biceps tenodesis 05/10/2022  SUBJECTIVE:                                                                                                                                                                                       SUBJECTIVE STATEMENT: He states still having pain in his shoulder and difficulty rasing his arm. He was been doing HEP and has pulleys at home he is using.  PERTINENT HISTORY: S/p left rotator cuff repair, biceps tenodesis 05/10/2022  PAIN:  NPRS scale: 5/10 Pain location: Lt shoulder Pain description: pressure Aggravating factors: end range movements, quick movements.  Relieving factors: sling,   PRECAUTIONS: Shoulder, RTC protocol  WEIGHT BEARING RESTRICTIONS: RTC protocol   FALLS:  Has patient fallen in last 6 months? No  OCCUPATION: Architect work  PLOF: Independent  PATIENT [place] to Architect work, get back to working out  OBJECTIVE:  PATIENT SURVEYS:  06/14/2022 Eval: FOTO 4% functional intake  COGNITION: 06/14/2022 Overall cognitive status: Within functional limits for tasks assessed     SENSATION: 06/14/2022 WFL  POSTURE:   UPPER EXTREMITY ROM:   Passive ROM Left Eval in supine  Left 06/23/2022 Left 06/30/2022 Left 07/06/2022  Shoulder flexion (tested with table slide) 85 Supine PROM: 65 deg limited by pain and guarding  AAROM with wand in supine:  130 AAROM with wand in supine 135  AROM in supine 125  Shoulder extension      Shoulder abduction 65     Shoulder adduction      Shoulder internal rotation 60     Shoulder external rotation 20     Elbow flexion      Elbow extension      Wrist flexion      Wrist extension      Wrist ulnar deviation      Wrist radial deviation      Wrist pronation      Wrist supination      (Blank rows = not tested)  UPPER EXTREMITY MMT:  MMT Left Did not test at Eval due to post op precaution   Shoulder flexion    Shoulder extension    Shoulder abduction    Shoulder adduction    Shoulder internal rotation    Shoulder external rotation    Middle trapezius    Lower trapezius    Elbow flexion    Elbow extension    Wrist flexion     Wrist extension    Wrist ulnar deviation    Wrist radial deviation    Wrist pronation    Wrist supination    Grip strength (lbs)    (Blank rows = not tested)  SHOULDER SPECIAL TESTS:   JOINT MOBILITY TESTING:    PALPATION:     TODAY'S TREATMENT:  07/11/2022 Therex Pulleys 2 min flexion, 2 min abduction Wall ladder X 10 flexion, X 10 abd UBE lvl 1.5 2.5 mins each way Standing green band rows bilateral 2 x 15 Standing green band GH ext bilateral 2 x 15  Standing AAROM with 1 lb bar x 10, stopping around 90 deg to avoid shrug Standing AAROM 1# bar abd X 10 Standing wand ER  5 sec hold x 10   Vasopnuematic 10 min, medium compression, 34 deg to Lt shoulder  07/06/2022 Therex UBE AAROM UE only lvl 1.5 3 mins each way Standing green band rows bilateral 2 x 15 Standing green band GH ext bilateral 2 x 15  Supine AAROM with 1 lb bar 2 x 10  Supine AROM in supine x 10  Supine wand ER c towel under arm 5 sec hold x 10  Standing painfree isometrics Lt arm at doorframe 5 sec on/off x 12 for ER and abduction c arm at side.   Adjusted HEP to reflect improvements in activity.  See HEP below for handout update.   Vasopnuematic 10 min, medium compression, 34 deg to Lt shoulder  07/04/2022 Therex UBE AAROM UE only lvl 1.0 3 mins each way with 2 min rest Seated pulley AAROM to tolerance 3 mins flexion, scaption each Supine AAROM with 2 lb bar c Rt hand closer to Lt hand (towards midline of body) 2 x 10  Supine chest press with 2 lb bar x20  Standing red band rows 2 x 10  Standing red band GH ext 2 x 10   Vasopnuematic 10 min, medium compression, 34 deg to Lt shoulder  06/30/2022 Therex UBE AAROM UE only lvl 1.0 3 mins each way with 2 min rest Seated pulley AAROM to tolerance 3 mins flexion, scaption each Supine AAROM with 1 lb bar c Rt hand closer to Lt hand (towards midline of body) 2 x 10   Vasopnuematic 10 min, medium compression, 34 deg to Lt  shoulder  06/27/2022  Therex Seated pulley AAROM to tolerance 3 mins flexion, scaption each Supine passive range Lt shoulder c Rt arm moving flexion x 15 (movement to tolerance)  Standing green band rows 2 x 10 Standing green band GH ext 2 x 10   Vasopnuematic 10 min, medium compression, 34 deg to Lt shoulder  06/23/2022 Manual: Lt shoulder supine passive range, g2 inferior mobs to tolerance (poor tolerance noted)  Therex Supine passive range Lt shoulder c Rt arm moving flexion 2 x 10 (movement to tolerance)  Seated table ER forward lean for Lt shoulder 5 sec hold x 10  Seated scapular retraction 5 sec hold x 10   Vasopnuematic 10 min, medium compression, 34 deg to Lt shoulder   PATIENT EDUCATION: 06/23/2022 Education details: HEP, PT plan of care Person educated: Patient Education method: Explanation, Demonstration, Verbal cues, and Handouts Education comprehension: verbalized understanding and needs further education   HOME EXERCISE PROGRAM: Access Code: [email or web address] URL: [email or web address] Date: 07/06/2022 Prepared by: Scot Jun  Exercises - Seated Scapular Retraction  - 2-3 x daily - 7 x weekly - 1 sets - 10 reps - Supine Shoulder External Rotation with Dowel at 20 Degrees of Abduction (Mirrored)  - 2-3 x daily - 7 x weekly - 1 sets - 10 reps - 5 hold - Supine Shoulder Flexion Extension AAROM with Dowel  - 2-3 x daily - 7 x weekly - 1-2 sets - 10-15 reps - 3 hold - Supine Shoulder Flexion Extension Full Range AROM  - 1-2 x daily - 7 x weekly - 1-2 sets - 10-15 reps - Standing Isometric Shoulder External Rotation with Doorway  - 1-2 x daily - 7 x weekly - 1 sets - 10 reps - 5 hold - Standing Isometric Shoulder Abduction with Doorway - Arm Bent  - 1-2 x daily - 7 x weekly - 1 sets - 10 reps - 5 hold  ASSESSMENT:  CLINICAL IMPRESSION:  Continued to work to improve his left shoulder ROM and strengthening while avoiding compensations due to weakness. He  will continue to benefit from skilled PT and I encouraged him to set up another 4 weeks of PT.  OBJECTIVE IMPAIRMENTS: decreased activity tolerance, decreased shoulder mobility, decreased ROM, decreased strength, impaired flexibility, impaired UE use,, and pain.  ACTIVITY LIMITATIONS: reaching, lifting, carry,  cleaning, driving, and or occupation  PERSONAL FACTORS: post op status also affecting patient's functional outcome.  REHAB POTENTIAL: Good  CLINICAL DECISION MAKING: Stable/uncomplicated  EVALUATION COMPLEXITY: Low    GOALS: Short term PT Goals Target date: 07/12/2022   Pt will be I and compliant with HEP. Baseline:  Goal status: Met Pt will decrease pain by 25% overall Baseline: Goal status: on going 07/06/2022  Long term PT goals Target date:09/06/2022   Pt will improve Rt shoulder AROM to WNL to improve functional reaching Baseline: Goal status: on going 07/06/2022 Pt will improve  Rt shoulder strength to at least 5-/5 MMT to improve functional strength Baseline: Goal status: on going 07/06/2022 Pt will improve FOTO  to at least 56% functional to show improved function Baseline: Goal status: on going 07/06/2022 Pt will reduce pain to overall less than 3/10 with usual activity and work activity. Baseline: Goal status: on going 07/06/2022  PLAN: PT FREQUENCY: 1-2 times per week   PT DURATION: 6-12 weeks  PLANNED INTERVENTIONS (unless contraindicated): aquatic PT, Canalith repositioning, cryotherapy, Electrical stimulation, Iontophoresis with 4 mg/ml dexamethasome, Moist heat, traction, Ultrasound, gait training, Therapeutic exercise, balance training, neuromuscular re-education, patient/family education, prosthetic training, manual techniques, passive ROM, dry needling, taping, vasopnuematic device, vestibular, spinal manipulations, joint manipulations  PLAN FOR NEXT SESSION:  AAROM and strengthening without shrug or compesations  no AROM or biceps resistance until  6 weeks post op 2022/07/11. (Has passed this now)   Elsie Ra, PT, DPT 07/11/22 8:55 AM

## 2022-07-13 ENCOUNTER — Ambulatory Visit (INDEPENDENT_AMBULATORY_CARE_PROVIDER_SITE_OTHER): Payer: Commercial Managed Care - PPO | Admitting: Physical Therapy

## 2022-07-13 ENCOUNTER — Encounter: Payer: Self-pay | Admitting: Physical Therapy

## 2022-07-13 DIAGNOSIS — R600 Localized edema: Secondary | ICD-10-CM | POA: Diagnosis not present

## 2022-07-13 DIAGNOSIS — M6281 Muscle weakness (generalized): Secondary | ICD-10-CM | POA: Diagnosis not present

## 2022-07-13 DIAGNOSIS — M25512 Pain in left shoulder: Secondary | ICD-10-CM

## 2022-07-13 NOTE — Therapy (Signed)
OUTPATIENT PHYSICAL THERAPY TREATMENT   Patient Name: Wesley Larson. MRN: [id number] DOB:November 09, 1975, 47 y.o., male Today's Date: 07/13/2022  END OF SESSION:  PT End of Session - 07/13/22 0900     Visit Number 8    Number of Visits 15    Date for PT Re-Evaluation 09/06/22    PT Start Time 0845    PT Stop Time 0933    PT Time Calculation (min) 48 min    Activity Tolerance Patient tolerated treatment well    Behavior During Therapy [facility] for tasks assessed/performed                  Past Medical History:  Diagnosis Date   Asthma    Past Surgical History:  Procedure Laterality Date   SHOULDER ARTHROSCOPY WITH ROTATOR CUFF REPAIR AND SUBACROMIAL DECOMPRESSION Left 05/10/2022   Procedure: LEFT SHOULDER ARTHROSCOPY, ROTATOR CUFF REPAIR, EXTENSIVE DEBRIDEMENT, BICEPS TENODESIS AND SUBACROMIAL DECOMPRESSION;  Surgeon: Leandrew Koyanagi, MD;  Location: [place];  Service: Orthopedics;  Laterality: Left;   Patient Active Problem List   Diagnosis Date Noted   Impingement syndrome of left shoulder 03/14/2022   Nontraumatic complete tear of left rotator cuff 03/14/2022   Cervical radiculopathy 10/05/2021   Capsulitis of shoulder, left 04/19/2021   Tobacco use disorder    Polysubstance abuse ([place])    Altered mental status 09/11/2015   Altered level of consciousness    Hypothermia    Bradycardia    Drug abuse ([place])     PCP: no PCP  REFERRING PROVIDER: Leandrew Koyanagi, MD  REFERRING DIAG: M75.42 (ICD-10-CM) - Impingement syndrome of left shoulder M75.122 (ICD-10-CM) - Nontraumatic complete tear of left rotator cuff  THERAPY DIAG:  Acute pain of left shoulder  Muscle weakness (generalized)  Localized edema  Rationale for Evaluation and Treatment: Rehabilitation  ONSET DATE: S/p left rotator cuff repair, biceps tenodesis 05/10/2022  SUBJECTIVE:                                                                                                                                                                                       SUBJECTIVE STATEMENT: He states he feels a lot better after last session in terms of his left shoulder pain and now he can lift his arm better  PERTINENT HISTORY: S/p left rotator cuff repair, biceps tenodesis 05/10/2022  PAIN:  NPRS scale: 3/10 Pain location: Lt shoulder Pain description: pressure Aggravating factors: end range movements, quick movements.  Relieving factors: sling,   PRECAUTIONS: Shoulder, RTC protocol  WEIGHT BEARING RESTRICTIONS: RTC protocol   FALLS:  Has patient fallen in last 6 months? No  OCCUPATION:  Construction work  PLOF: Independent  PATIENT GOALS:return to Architect work, get back to working out  OBJECTIVE:  PATIENT SURVEYS:  06/14/2022 Eval: FOTO 4% functional intake  COGNITION: 06/14/2022 Overall cognitive status: Within functional limits for tasks assessed     SENSATION: 06/14/2022 WFL  POSTURE:   UPPER EXTREMITY ROM:   Passive ROM Left Eval in supine  Left 06/23/2022 Left 06/30/2022 Left 07/06/2022  Shoulder flexion (tested with table slide) 85 Supine PROM: 65 deg limited by pain and guarding  AAROM with wand in supine:  130 AAROM with wand in supine 135  AROM in supine 125  Shoulder extension      Shoulder abduction 65     Shoulder adduction      Shoulder internal rotation 60     Shoulder external rotation 20     Elbow flexion      Elbow extension      Wrist flexion      Wrist extension      Wrist ulnar deviation      Wrist radial deviation      Wrist pronation      Wrist supination      (Blank rows = not tested)  UPPER EXTREMITY MMT:  MMT Left Did not test at Eval due to post op precaution   Shoulder flexion    Shoulder extension    Shoulder abduction    Shoulder adduction    Shoulder internal rotation    Shoulder external rotation    Middle trapezius    Lower trapezius    Elbow flexion    Elbow extension    Wrist flexion    Wrist  extension    Wrist ulnar deviation    Wrist radial deviation    Wrist pronation    Wrist supination    Grip strength (lbs)    (Blank rows = not tested)  SHOULDER SPECIAL TESTS:   JOINT MOBILITY TESTING:    PALPATION:     TODAY'S TREATMENT:  07/11/2022 Therex Pulleys 2 min flexion, 2 min abduction Wall ladder X 10 flexion, X 10 abd UBE lvl 1.5 2.5 mins each way UE ranger X 10 flexion, X 10 abd, X 10 circles CW and CCW in flexion Standing green band rows bilateral 2 x 15 Standing green band GH ext bilateral 2 x 15  Standing IR with red 2X10 Standing ER with red 2X10 Standing AAROM with 2 lb bar x 15 Standing AROM shoulder abd with short lever (elbows bent) 2X10  Vasopnuematic 10 min, medium compression, 34 deg to Lt shoulder   PATIENT EDUCATION: 06/23/2022 Education details: HEP, PT plan of care Person educated: Patient Education method: Explanation, Demonstration, Verbal cues, and Handouts Education comprehension: verbalized understanding and needs further education   HOME EXERCISE PROGRAM: Access Code: [email or web address] URL: [email or web address] Date: 07/06/2022 Prepared by: Scot Jun  Exercises - Seated Scapular Retraction  - 2-3 x daily - 7 x weekly - 1 sets - 10 reps - Supine Shoulder External Rotation with Dowel at 20 Degrees of Abduction (Mirrored)  - 2-3 x daily - 7 x weekly - 1 sets - 10 reps - 5 hold - Supine Shoulder Flexion Extension AAROM with Dowel  - 2-3 x daily - 7 x weekly - 1-2 sets - 10-15 reps - 3 hold - Supine Shoulder Flexion Extension Full Range AROM  - 1-2 x daily - 7 x weekly - 1-2 sets - 10-15 reps - Standing Isometric Shoulder External Rotation with Doorway  - 1-2 x daily -  7 x weekly - 1 sets - 10 reps - 5 hold - Standing Isometric Shoulder Abduction with Doorway - Arm Bent  - 1-2 x daily - 7 x weekly - 1 sets - 10 reps - 5 hold  Updated HEP 07/13/22 to change isometrics to active ER and IR with red band 2X10, and active  shoulder abduction short lever to avoid shrug 2X10 (he declines needing print out of this)  ASSESSMENT:  CLINICAL IMPRESSION:  He showed improvements in left shoulder strength and ROM today and had good tolerance to exercises. He does still lack functional left shoulder strength needed for ADL's and work activities and would continue to benefit from skilled PT. Due to his progress I updated HEP 07/13/22 to change isometrics to active ER and IR with red band 2X10, and active shoulder abduction short lever to avoid shrug 2X10 (he declines needing print out of this). He has now met short term PT goals.  OBJECTIVE IMPAIRMENTS: decreased activity tolerance, decreased shoulder mobility, decreased ROM, decreased strength, impaired flexibility, impaired UE use,, and pain.  ACTIVITY LIMITATIONS: reaching, lifting, carry,  cleaning, driving, and or occupation  PERSONAL FACTORS: post op status also affecting patient's functional outcome.  REHAB POTENTIAL: Good  CLINICAL DECISION MAKING: Stable/uncomplicated  EVALUATION COMPLEXITY: Low    GOALS: Short term PT Goals Target date: 07/12/2022   Pt will be I and compliant with HEP. Baseline:  Goal status: Met 07/13/22 Pt will decrease pain by 25% overall Baseline: Goal status: MET 07/13/22  Long term PT goals Target date:09/06/2022   Pt will improve Rt shoulder AROM to WNL to improve functional reaching Baseline: Goal status: on going 07/06/2022 Pt will improve  Rt shoulder strength to at least 5-/5 MMT to improve functional strength Baseline: Goal status: on going 07/06/2022 Pt will improve FOTO to at least 56% functional to show improved function Baseline: Goal status: on going 07/06/2022 Pt will reduce pain to overall less than 3/10 with usual activity and work activity. Baseline: Goal status: on going 07/06/2022  PLAN: PT FREQUENCY: 1-2 times per week   PT DURATION: 6-12 weeks  PLANNED INTERVENTIONS (unless contraindicated): aquatic  PT, Canalith repositioning, cryotherapy, Electrical stimulation, Iontophoresis with 4 mg/ml dexamethasome, Moist heat, traction, Ultrasound, gait training, Therapeutic exercise, balance training, neuromuscular re-education, patient/family education, prosthetic training, manual techniques, passive ROM, dry needling, taping, vasopnuematic device, vestibular, spinal manipulations, joint manipulations  PLAN FOR NEXT SESSION:  AAROM and strengthening without shrug or compesations    Elsie Ra, PT, DPT 07/13/22 9:01 AM

## 2022-07-18 ENCOUNTER — Other Ambulatory Visit (HOSPITAL_BASED_OUTPATIENT_CLINIC_OR_DEPARTMENT_OTHER): Payer: Self-pay | Admitting: Orthopaedic Surgery

## 2022-07-18 ENCOUNTER — Encounter: Payer: Self-pay | Admitting: Orthopaedic Surgery

## 2022-07-18 ENCOUNTER — Encounter: Payer: Self-pay | Admitting: Rehabilitative and Restorative Service Providers"

## 2022-07-18 ENCOUNTER — Ambulatory Visit (INDEPENDENT_AMBULATORY_CARE_PROVIDER_SITE_OTHER): Payer: Commercial Managed Care - PPO | Admitting: Rehabilitative and Restorative Service Providers"

## 2022-07-18 DIAGNOSIS — M25512 Pain in left shoulder: Secondary | ICD-10-CM

## 2022-07-18 DIAGNOSIS — M6281 Muscle weakness (generalized): Secondary | ICD-10-CM

## 2022-07-18 DIAGNOSIS — R600 Localized edema: Secondary | ICD-10-CM

## 2022-07-18 MED ORDER — HYDROCODONE-ACETAMINOPHEN 5-325 MG PO TABS: 1.0000 | ORAL_TABLET | Freq: Every day | ORAL | 0 refills | Status: DC | PRN

## 2022-07-18 NOTE — Therapy (Signed)
OUTPATIENT PHYSICAL THERAPY TREATMENT   Patient Name: Wesley Larson. MRN: [id number] DOB:05/25/1975, 47 y.o., male Today's Date: 07/18/2022  END OF SESSION:  PT End of Session - 07/18/22 0932     Visit Number 9    Number of Visits 15    Date for PT Re-Evaluation 09/06/22    PT Start Time 0927    PT Stop Time 1006    PT Time Calculation (min) 39 min    Activity Tolerance Patient tolerated treatment well    Behavior During Therapy [facility] for tasks assessed/performed                   Past Medical History:  Diagnosis Date   Asthma    Past Surgical History:  Procedure Laterality Date   SHOULDER ARTHROSCOPY WITH ROTATOR CUFF REPAIR AND SUBACROMIAL DECOMPRESSION Left 05/10/2022   Procedure: LEFT SHOULDER ARTHROSCOPY, ROTATOR CUFF REPAIR, EXTENSIVE DEBRIDEMENT, BICEPS TENODESIS AND SUBACROMIAL DECOMPRESSION;  Surgeon: Leandrew Koyanagi, MD;  Location: [place];  Service: Orthopedics;  Laterality: Left;   Patient Active Problem List   Diagnosis Date Noted   Impingement syndrome of left shoulder 03/14/2022   Nontraumatic complete tear of left rotator cuff 03/14/2022   Cervical radiculopathy 10/05/2021   Capsulitis of shoulder, left 04/19/2021   Tobacco use disorder    Polysubstance abuse ([place])    Altered mental status 09/11/2015   Altered level of consciousness    Hypothermia    Bradycardia    Drug abuse ([place])     PCP: no PCP  REFERRING PROVIDER: Leandrew Koyanagi, MD  REFERRING DIAG: M75.42 (ICD-10-CM) - Impingement syndrome of left shoulder M75.122 (ICD-10-CM) - Nontraumatic complete tear of left rotator cuff  THERAPY DIAG:  Acute pain of left shoulder  Muscle weakness (generalized)  Localized edema  Rationale for Evaluation and Treatment: Rehabilitation  ONSET DATE: S/p left rotator cuff repair, biceps tenodesis 05/10/2022  SUBJECTIVE:                                                                                                                                                                                       SUBJECTIVE STATEMENT: Reported 7/10 pain today, unsure why.  He indicated doing different exercise this weekend and felt some aggravation.  Overall he did report feeling like he can do more.   PERTINENT HISTORY: S/p left rotator cuff repair, biceps tenodesis 05/10/2022  PAIN:  NPRS scale: 7/10 Pain location: Lt shoulder Pain description: pressure Aggravating factors: end range movements, quick movements.  Relieving factors: sling,   PRECAUTIONS: Shoulder, RTC protocol  WEIGHT BEARING RESTRICTIONS: RTC protocol   FALLS:  Has patient fallen in  last 6 months? No  OCCUPATION: Construction work  PLOF: Independent  PATIENT [place] to Architect work, get back to working out  OBJECTIVE:  PATIENT SURVEYS:  07/18/2022:  FOTO update:  59  06/14/2022 Eval: FOTO 4% functional intake  COGNITION: 06/14/2022 Overall cognitive status: Within functional limits for tasks assessed     SENSATION: 06/14/2022 WFL  POSTURE:   UPPER EXTREMITY ROM:   Passive ROM Left Eval in supine  Left 06/23/2022 Left 06/30/2022 Left 07/06/2022  Shoulder flexion (tested with table slide) 85 Supine PROM: 65 deg limited by pain and guarding  AAROM with wand in supine:  130 AAROM with wand in supine 135  AROM in supine 125  Shoulder extension      Shoulder abduction 65     Shoulder adduction      Shoulder internal rotation 60     Shoulder external rotation 20     Elbow flexion      Elbow extension      Wrist flexion      Wrist extension      Wrist ulnar deviation      Wrist radial deviation      Wrist pronation      Wrist supination      (Blank rows = not tested)  UPPER EXTREMITY MMT:  MMT Left Did not test at Eval due to post op precaution Left 07/18/2022  Shoulder flexion  4/5  Shoulder extension    Shoulder abduction  3+/5  Shoulder adduction    Shoulder internal rotation    Shoulder external rotation     Middle trapezius    Lower trapezius    Elbow flexion    Elbow extension    Wrist flexion    Wrist extension    Wrist ulnar deviation    Wrist radial deviation    Wrist pronation    Wrist supination    Grip strength (lbs)    (Blank rows = not tested)  SHOULDER SPECIAL TESTS:   JOINT MOBILITY TESTING:    PALPATION:     TODAY'S TREATMENT:  07/18/2022 Therex UBE lvl 2.5 3 mins fwd/back each way Pulleys 2 min flexion, 2 min abduction c eccentric lowering Lt arm outstretched Tband Lt shoulder ER c towel under arm isometric reactive step outs green band 5 sec hold x 10 Tband Lt shoulder IR c towel under arm green band 2 x 15 Standing UE range assisted circles clockwise and counter clockwise x 30 each way approx. 100 deg Lt shoulder flexion  Sitting Lt shoulder eccentric flexion lowering only (help by Rt to reach end range) 2 x 15  Vasopnuematic Deferred by Patient today+  07/11/2022 Therex Pulleys 2 min flexion, 2 min abduction Wall ladder X 10 flexion, X 10 abd UBE lvl 1.5 2.5 mins each way UE ranger X 10 flexion, X 10 abd, X 10 circles CW and CCW in flexion Standing green band rows bilateral 2 x 15 Standing green band GH ext bilateral 2 x 15  Standing IR with red 2X10 Standing ER with red 2X10 Standing AAROM with 2 lb bar x 15 Standing AROM shoulder abd with short lever (elbows bent) 2X10  Vasopnuematic 10 min, medium compression, 34 deg to Lt shoulder   PATIENT EDUCATION: 06/23/2022 Education details: HEP, PT plan of care Person educated: Patient Education method: Explanation, Demonstration, Verbal cues, and Handouts Education comprehension: verbalized understanding and needs further education   HOME EXERCISE PROGRAM: Access Code: [email or web address] URL: [email or web address] Date: 07/06/2022 Prepared by: Scot Jun  Exercises -  Seated Scapular Retraction  - 2-3 x daily - 7 x weekly - 1 sets - 10 reps - Supine Shoulder External Rotation with  Dowel at 20 Degrees of Abduction (Mirrored)  - 2-3 x daily - 7 x weekly - 1 sets - 10 reps - 5 hold - Supine Shoulder Flexion Extension AAROM with Dowel  - 2-3 x daily - 7 x weekly - 1-2 sets - 10-15 reps - 3 hold - Supine Shoulder Flexion Extension Full Range AROM  - 1-2 x daily - 7 x weekly - 1-2 sets - 10-15 reps - Standing Isometric Shoulder External Rotation with Doorway  - 1-2 x daily - 7 x weekly - 1 sets - 10 reps - 5 hold - Standing Isometric Shoulder Abduction with Doorway - Arm Bent  - 1-2 x daily - 7 x weekly - 1 sets - 10 reps - 5 hold  Updated HEP 07/13/22 to change isometrics to active ER and IR with red band 2X10, and active shoulder abduction short lever to avoid shrug 2X10 (he declines needing print out of this)  ASSESSMENT:  CLINICAL IMPRESSION:  Improvement in active movement and strength progression at this time as assessed.  Continued end range symptoms/tightness noted.  Fatigue evident in newer strengthening activity.  Continued skilled PT services indicated at this time.   OBJECTIVE IMPAIRMENTS: decreased activity tolerance, decreased shoulder mobility, decreased ROM, decreased strength, impaired flexibility, impaired UE use,, and pain.  ACTIVITY LIMITATIONS: reaching, lifting, carry,  cleaning, driving, and or occupation  PERSONAL FACTORS: post op status also affecting patient's functional outcome.  REHAB POTENTIAL: Good  CLINICAL DECISION MAKING: Stable/uncomplicated  EVALUATION COMPLEXITY: Low    GOALS: Short term PT Goals Target date: 07/12/2022   Pt will be I and compliant with HEP. Baseline:  Goal status: Met 07/13/22 Pt will decrease pain by 25% overall Baseline: Goal status: MET 07/13/22  Long term PT goals Target date:09/06/2022   Pt will improve Rt shoulder AROM to WNL to improve functional reaching Baseline: Goal status: on going 07/06/2022 Pt will improve  Rt shoulder strength to at least 5-/5 MMT to improve functional  strength Baseline: Goal status: on going 07/06/2022 Pt will improve FOTO to at least 56% functional to show improved function Baseline: Goal status: on going 07/06/2022 Pt will reduce pain to overall less than 3/10 with usual activity and work activity. Baseline: Goal status: on going 07/06/2022  PLAN: PT FREQUENCY: 1-2 times per week   PT DURATION: 6-12 weeks  PLANNED INTERVENTIONS (unless contraindicated): aquatic PT, Canalith repositioning, cryotherapy, Electrical stimulation, Iontophoresis with 4 mg/ml dexamethasome, Moist heat, traction, Ultrasound, gait training, Therapeutic exercise, balance training, neuromuscular re-education, patient/family education, prosthetic training, manual techniques, passive ROM, dry needling, taping, vasopnuematic device, vestibular, spinal manipulations, joint manipulations  PLAN FOR NEXT SESSION:  AROM progression as tolerated, strengthening to tolerance.    Scot Jun, PT, DPT, OCS, ATC 07/18/22  10:01 AM

## 2022-07-20 ENCOUNTER — Encounter: Payer: Commercial Managed Care - PPO | Admitting: Physical Therapy

## 2022-07-21 ENCOUNTER — Encounter: Payer: Self-pay | Admitting: Orthopaedic Surgery

## 2022-07-24 ENCOUNTER — Other Ambulatory Visit: Payer: Self-pay | Admitting: Physician Assistant

## 2022-07-24 MED ORDER — ACETAMINOPHEN-CODEINE 300-30 MG PO TABS: 1.0000 | ORAL_TABLET | Freq: Two times a day (BID) | ORAL | 0 refills | Status: DC | PRN

## 2022-07-24 NOTE — Telephone Encounter (Signed)
Sent tylenol #3 

## 2022-07-25 ENCOUNTER — Encounter: Payer: Self-pay | Admitting: Orthopaedic Surgery

## 2022-07-25 ENCOUNTER — Encounter: Payer: Commercial Managed Care - PPO | Admitting: Physical Therapy

## 2022-07-25 ENCOUNTER — Other Ambulatory Visit: Payer: Self-pay | Admitting: Physician Assistant

## 2022-07-25 MED ORDER — METHOCARBAMOL 750 MG PO TABS: 750.0000 mg | ORAL_TABLET | Freq: Two times a day (BID) | ORAL | 3 refills | Status: DC | PRN

## 2022-07-25 NOTE — Telephone Encounter (Signed)
sent 

## 2022-07-26 ENCOUNTER — Other Ambulatory Visit: Payer: Self-pay | Admitting: Physician Assistant

## 2022-07-26 ENCOUNTER — Encounter: Payer: Self-pay | Admitting: Orthopaedic Surgery

## 2022-07-26 DIAGNOSIS — Z9889 Other specified postprocedural states: Secondary | ICD-10-CM

## 2022-07-26 NOTE — Telephone Encounter (Signed)
Too far out from surgery for norco

## 2022-07-26 NOTE — Progress Notes (Unsigned)
   Post-Op Visit Note   Patient: Wesley Larson.           Date of Birth: July 24, 1975           MRN: [id number] Visit Date: 07/27/2022 PCP: Patient, No Pcp Per   Assessment & Plan:  Chief Complaint: No chief complaint on file.  Visit Diagnoses:  1. S/P arthroscopy of left shoulder     Plan: ***  Follow-Up Instructions: No follow-ups on file.   Orders:  No orders of the defined types were placed in this encounter.  No orders of the defined types were placed in this encounter.   Imaging: No results found.  PMFS History: Patient Active Problem List   Diagnosis Date Noted   Impingement syndrome of left shoulder 03/14/2022   Nontraumatic complete tear of left rotator cuff 03/14/2022   Cervical radiculopathy 10/05/2021   Capsulitis of shoulder, left 04/19/2021   Tobacco use disorder    Polysubstance abuse    Altered mental status 09/11/2015   Altered level of consciousness    Hypothermia    Bradycardia    Drug abuse    Past Medical History:  Diagnosis Date   Asthma     No family history on file.  Past Surgical History:  Procedure Laterality Date   SHOULDER ARTHROSCOPY WITH ROTATOR CUFF REPAIR AND SUBACROMIAL DECOMPRESSION Left 05/10/2022   Procedure: LEFT SHOULDER ARTHROSCOPY, ROTATOR CUFF REPAIR, EXTENSIVE DEBRIDEMENT, BICEPS TENODESIS AND SUBACROMIAL DECOMPRESSION;  Surgeon: Leandrew Koyanagi, MD;  Location: [place];  Service: Orthopedics;  Laterality: Left;   Social History   Occupational History   Not on file  Tobacco Use   Smoking status: Every Day    Packs/day: .5    Types: Cigarettes   Smokeless tobacco: Never  Vaping Use   Vaping Use: Never used  Substance and Sexual Activity   Alcohol use: No   Drug use: Yes    Types: Marijuana    Comment: a couple of times per week   Sexual activity: Yes

## 2022-07-26 NOTE — Telephone Encounter (Signed)
I am pretty sure he told us just yesterday that there is a reason he cannot take tramdol and so tylenol 3 was refilled.  I also refilled robaxin.  We can refer to pain management if he would like

## 2022-07-27 ENCOUNTER — Ambulatory Visit (INDEPENDENT_AMBULATORY_CARE_PROVIDER_SITE_OTHER): Payer: Commercial Managed Care - PPO | Admitting: Orthopaedic Surgery

## 2022-07-27 ENCOUNTER — Ambulatory Visit (INDEPENDENT_AMBULATORY_CARE_PROVIDER_SITE_OTHER): Payer: Commercial Managed Care - PPO | Admitting: Physical Therapy

## 2022-07-27 ENCOUNTER — Encounter: Payer: Self-pay | Admitting: Physical Therapy

## 2022-07-27 DIAGNOSIS — M25512 Pain in left shoulder: Secondary | ICD-10-CM | POA: Diagnosis not present

## 2022-07-27 DIAGNOSIS — Z9889 Other specified postprocedural states: Secondary | ICD-10-CM

## 2022-07-27 DIAGNOSIS — R600 Localized edema: Secondary | ICD-10-CM | POA: Diagnosis not present

## 2022-07-27 DIAGNOSIS — M6281 Muscle weakness (generalized): Secondary | ICD-10-CM | POA: Diagnosis not present

## 2022-07-27 NOTE — Therapy (Addendum)
OUTPATIENT PHYSICAL THERAPY TREATMENT /DISCHARGE   Patient Name: Wesley Larson. MRN: [id number] DOB:1975/06/27, 47 y.o., male Today's Date: 07/27/2022  END OF SESSION:  PT End of Session - 07/27/22 0845     Visit Number 10    Number of Visits 15    Date for PT Re-Evaluation 09/06/22    PT Start Time 0843    PT Stop Time 0921    PT Time Calculation (min) 38 min    Activity Tolerance Patient tolerated treatment well    Behavior During Therapy [facility] for tasks assessed/performed                   Past Medical History:  Diagnosis Date   Asthma    Past Surgical History:  Procedure Laterality Date   SHOULDER ARTHROSCOPY WITH ROTATOR CUFF REPAIR AND SUBACROMIAL DECOMPRESSION Left 05/10/2022   Procedure: LEFT SHOULDER ARTHROSCOPY, ROTATOR CUFF REPAIR, EXTENSIVE DEBRIDEMENT, BICEPS TENODESIS AND SUBACROMIAL DECOMPRESSION;  Surgeon: Tarry Kos, MD;  Location: [facility];  Service: Orthopedics;  Laterality: Left;   Patient Active Problem List   Diagnosis Date Noted   Impingement syndrome of left shoulder 03/14/2022   Nontraumatic complete tear of left rotator cuff 03/14/2022   Cervical radiculopathy 10/05/2021   Capsulitis of shoulder, left 04/19/2021   Tobacco use disorder    Polysubstance abuse    Altered mental status 09/11/2015   Altered level of consciousness    Hypothermia    Bradycardia    Drug abuse     PCP: no PCP  REFERRING PROVIDER: Tarry Kos, MD  REFERRING DIAG: M75.42 (ICD-10-CM) - Impingement syndrome of left shoulder M75.122 (ICD-10-CM) - Nontraumatic complete tear of left rotator cuff  THERAPY DIAG:  Acute pain of left shoulder  Muscle weakness (generalized)  Localized edema  Rationale for Evaluation and Treatment: Rehabilitation  ONSET DATE: S/p left rotator cuff repair, biceps tenodesis 05/10/2022  SUBJECTIVE:                                                                                                                                                                                       SUBJECTIVE STATEMENT: Reported he was sick last week so was not able to come. He says he has been having a lot of pain at night in his shoulder and also his neck. He will see MD today and says they will refer him to pain management.  PERTINENT HISTORY: S/p left rotator cuff repair, biceps tenodesis 05/10/2022  PAIN:  NPRS scale: 7/10 Pain location: Lt shoulder Pain description: pressure Aggravating factors: end range movements, quick movements.  Relieving factors: sling,   PRECAUTIONS: Shoulder, RTC protocol  WEIGHT BEARING RESTRICTIONS: RTC protocol   FALLS:  Has patient fallen in last 6 months? No  OCCUPATION: Construction work  PLOF: Independent  PATIENT GOALS:return to [employer] work, get back to working out  OBJECTIVE:  PATIENT SURVEYS:  07/18/2022:  FOTO update:  59  06/14/2022 Eval: FOTO 4% functional intake  COGNITION: 06/14/2022 Overall cognitive status: Within functional limits for tasks assessed     SENSATION: 06/14/2022 [facility]  POSTURE:   UPPER EXTREMITY ROM:   Passive ROM Left Eval in supine  Left 06/23/2022 Left 06/30/2022 Left 07/06/2022 Left 07/27/22  Shoulder flexion (tested with table slide) 85 Supine PROM: 65 deg limited by pain and guarding  AAROM with wand in supine:  130 AAROM with wand in supine 135  AROM in supine 125 AROM 140  Shoulder extension       Shoulder abduction 65    AROM 120  Shoulder adduction       Shoulder internal rotation 60      Shoulder external rotation 20      Elbow flexion       Elbow extension       Wrist flexion       Wrist extension       Wrist ulnar deviation       Wrist radial deviation       Wrist pronation       Wrist supination       (Blank rows = not tested)  UPPER EXTREMITY MMT:  MMT Left Did not test at Eval due to post op precaution Left 07/18/2022 Left 07/27/22  Shoulder flexion  4/5 4  Shoulder extension     Shoulder  abduction  3+/5 4-  Shoulder adduction     Shoulder internal rotation   5  Shoulder external rotation   4+  Middle trapezius     Lower trapezius     Elbow flexion     Elbow extension     Wrist flexion     Wrist extension     Wrist ulnar deviation     Wrist radial deviation     Wrist pronation     Wrist supination     Grip strength (lbs)     (Blank rows = not tested)  SHOULDER SPECIAL TESTS:   JOINT MOBILITY TESTING:    PALPATION:     TODAY'S TREATMENT:  07/27/2022 Therex UBE lvl 3, 3 mins fwd/back each way Pulleys 2 min flexion, 2 min abduction c eccentric lowering Lt arm outstretched Rows green band X 20 Shoulder extensions green band X 20 Tband Lt shoulder ER c towel under arm, green band 2X10 Tband Lt shoulder IR c towel under arm green band 2 x 10 Standing UE range assisted circles clockwise and counter clockwise x 10, flexion X 10, abduction X 10 Standing shoulder flexion AAROM 2# bar 2X10 Standing lifting to 2nd shelf in cabinet 1# X 10 abduction, 2# X 10 flexion  Vasopnuematic Deferred by Patient today+  07/18/2022 Therex UBE lvl 2.5 3 mins fwd/back each way Pulleys 2 min flexion, 2 min abduction c eccentric lowering Lt arm outstretched Tband Lt shoulder ER c towel under arm isometric reactive step outs green band 5 sec hold x 10 Tband Lt shoulder IR c towel under arm green band 2 x 15 Standing UE range assisted circles clockwise and counter clockwise x 30 each way approx. 100 deg Lt shoulder flexion  Sitting Lt shoulder eccentric flexion lowering only (help by Rt to reach end range) 2 x  15  Vasopnuematic Deferred by Patient today+  07/11/2022 Therex Pulleys 2 min flexion, 2 min abduction Wall ladder X 10 flexion, X 10 abd UBE lvl 1.5 2.5 mins each way UE ranger X 10 flexion, X 10 abd, X 10 circles CW and CCW in flexion Standing green band rows bilateral 2 x 15 Standing green band GH ext bilateral 2 x 15  Standing IR with red 2X10 Standing ER with  red 2X10 Standing AAROM with 2 lb bar x 15 Standing AROM shoulder abd with short lever (elbows bent) 2X10  Vasopnuematic 10 min, medium compression, 34 deg to Lt shoulder   PATIENT EDUCATION: 06/23/2022 Education details: HEP, PT plan of care Person educated: Patient Education method: Explanation, Demonstration, Verbal cues, and Handouts Education comprehension: verbalized understanding and needs further education   HOME EXERCISE PROGRAM: Access Code: [email or web address] URL: [email or web address] Date: 07/06/2022 Prepared by: Chyrel Masson  Exercises - Seated Scapular Retraction  - 2-3 x daily - 7 x weekly - 1 sets - 10 reps - Supine Shoulder External Rotation with Dowel at 20 Degrees of Abduction (Mirrored)  - 2-3 x daily - 7 x weekly - 1 sets - 10 reps - 5 hold - Supine Shoulder Flexion Extension AAROM with Dowel  - 2-3 x daily - 7 x weekly - 1-2 sets - 10-15 reps - 3 hold - Supine Shoulder Flexion Extension Full Range AROM  - 1-2 x daily - 7 x weekly - 1-2 sets - 10-15 reps - Standing Isometric Shoulder External Rotation with Doorway  - 1-2 x daily - 7 x weekly - 1 sets - 10 reps - 5 hold - Standing Isometric Shoulder Abduction with Doorway - Arm Bent  - 1-2 x daily - 7 x weekly - 1 sets - 10 reps - 5 hold  Updated HEP 07/13/22 to change isometrics to active ER and IR with red band 2X10, and active shoulder abduction short lever to avoid shrug 2X10 (he declines needing print out of this)  ASSESSMENT:  CLINICAL IMPRESSION:  His strength and ROM are improving with PT but he has been more limited with Lt shoulder and overall neck pain lately. He does report he is also in need of neck surgery but MD wanted him to have his shoulder surgery first. He will follow up with shoulder MD today and it seems like he will also be referred to pain management. He has not yet met his strength goals so PT would recommend to continue with strengthening within his allowed pain tolerance.    OBJECTIVE IMPAIRMENTS: decreased activity tolerance, decreased shoulder mobility, decreased ROM, decreased strength, impaired flexibility, impaired UE use,, and pain.  ACTIVITY LIMITATIONS: reaching, lifting, carry,  cleaning, driving, and or occupation  PERSONAL FACTORS: post op status also affecting patient's functional outcome.  REHAB POTENTIAL: Good  CLINICAL DECISION MAKING: Stable/uncomplicated  EVALUATION COMPLEXITY: Low    GOALS: Short term PT Goals Target date: 07/12/2022   Pt will be I and compliant with HEP. Baseline:  Goal status: Met 07/13/22 Pt will decrease pain by 25% overall Baseline: Goal status: MET 07/13/22  Long term PT goals Target date:09/06/2022   Pt will improve Rt shoulder AROM to WNL to improve functional reaching Baseline: Goal status: on going 07/27/2022 Pt will improve  Rt shoulder strength to at least 5-/5 MMT to improve functional strength Baseline: Goal status: on going 4/42024 Pt will improve FOTO to at least 56% functional to show improved function Baseline: Goal status: on going 07/27/2022 Pt will reduce pain  to overall less than 3/10 with usual activity and work activity. Baseline: Goal status: on going 07/27/2022  PLAN: PT FREQUENCY: 1-2 times per week   PT DURATION: 6-12 weeks  PLANNED INTERVENTIONS (unless contraindicated): aquatic PT, Canalith repositioning, cryotherapy, Electrical stimulation, Iontophoresis with 4 mg/ml dexamethasome, Moist heat, traction, Ultrasound, gait training, Therapeutic exercise, balance training, neuromuscular re-education, patient/family education, prosthetic training, manual techniques, passive ROM, dry needling, taping, vasopnuematic device, vestibular, spinal manipulations, joint manipulations  PLAN FOR NEXT SESSION:  AROM progression as tolerated, strengthening to tolerance.   Ivery Quale, PT, DPT 07/27/22 9:16 AM   PHYSICAL THERAPY DISCHARGE SUMMARY  Visits from Start of Care: 10  Current  functional level related to goals / functional outcomes: See note   Remaining deficits: See note   Education / Equipment: HEP  Patient goals were partially met. Patient is being discharged due to not returning since the last visit.  Chyrel Masson, PT, DPT, OCS, ATC 08/28/22  2:21 PM

## 2022-07-31 ENCOUNTER — Encounter: Payer: Self-pay | Admitting: Orthopaedic Surgery

## 2022-08-01 ENCOUNTER — Encounter: Payer: Commercial Managed Care - PPO | Admitting: Physical Therapy

## 2022-08-03 ENCOUNTER — Encounter: Payer: Commercial Managed Care - PPO | Admitting: Physical Therapy

## 2022-08-04 ENCOUNTER — Encounter: Payer: Self-pay | Admitting: Orthopaedic Surgery

## 2022-08-07 ENCOUNTER — Encounter: Payer: Self-pay | Admitting: Orthopaedic Surgery

## 2022-08-07 ENCOUNTER — Encounter: Payer: Self-pay | Admitting: *Deleted

## 2022-08-07 ENCOUNTER — Other Ambulatory Visit: Payer: Self-pay | Admitting: Physician Assistant

## 2022-08-07 NOTE — Telephone Encounter (Signed)
Please have him confirm where he wants this sent before we send in.  Also, let him know this is last refill and that it can take a while to get into pain management.  Can see if pcp can help him out in meantime

## 2022-08-08 ENCOUNTER — Encounter: Payer: Self-pay | Admitting: Orthopaedic Surgery

## 2022-08-08 ENCOUNTER — Other Ambulatory Visit: Payer: Self-pay | Admitting: Physician Assistant

## 2022-08-08 ENCOUNTER — Encounter: Payer: Commercial Managed Care - PPO | Admitting: Physical Therapy

## 2022-08-08 MED ORDER — ACETAMINOPHEN-CODEINE 300-30 MG PO TABS: 1.0000 | ORAL_TABLET | Freq: Two times a day (BID) | ORAL | 0 refills | Status: DC | PRN

## 2022-08-08 NOTE — Telephone Encounter (Signed)
sent 

## 2022-08-10 ENCOUNTER — Encounter: Payer: Commercial Managed Care - PPO | Admitting: Physical Therapy

## 2023-02-25 IMAGING — CR DG SHOULDER 2+V*L*
3 series · 3 of 3 positions shown · non-contrast
Comparison: None.

CLINICAL DATA: Left shoulder pain

EXAM:
LEFT SHOULDER - 2+ VIEW

[w shoulder grashey left]
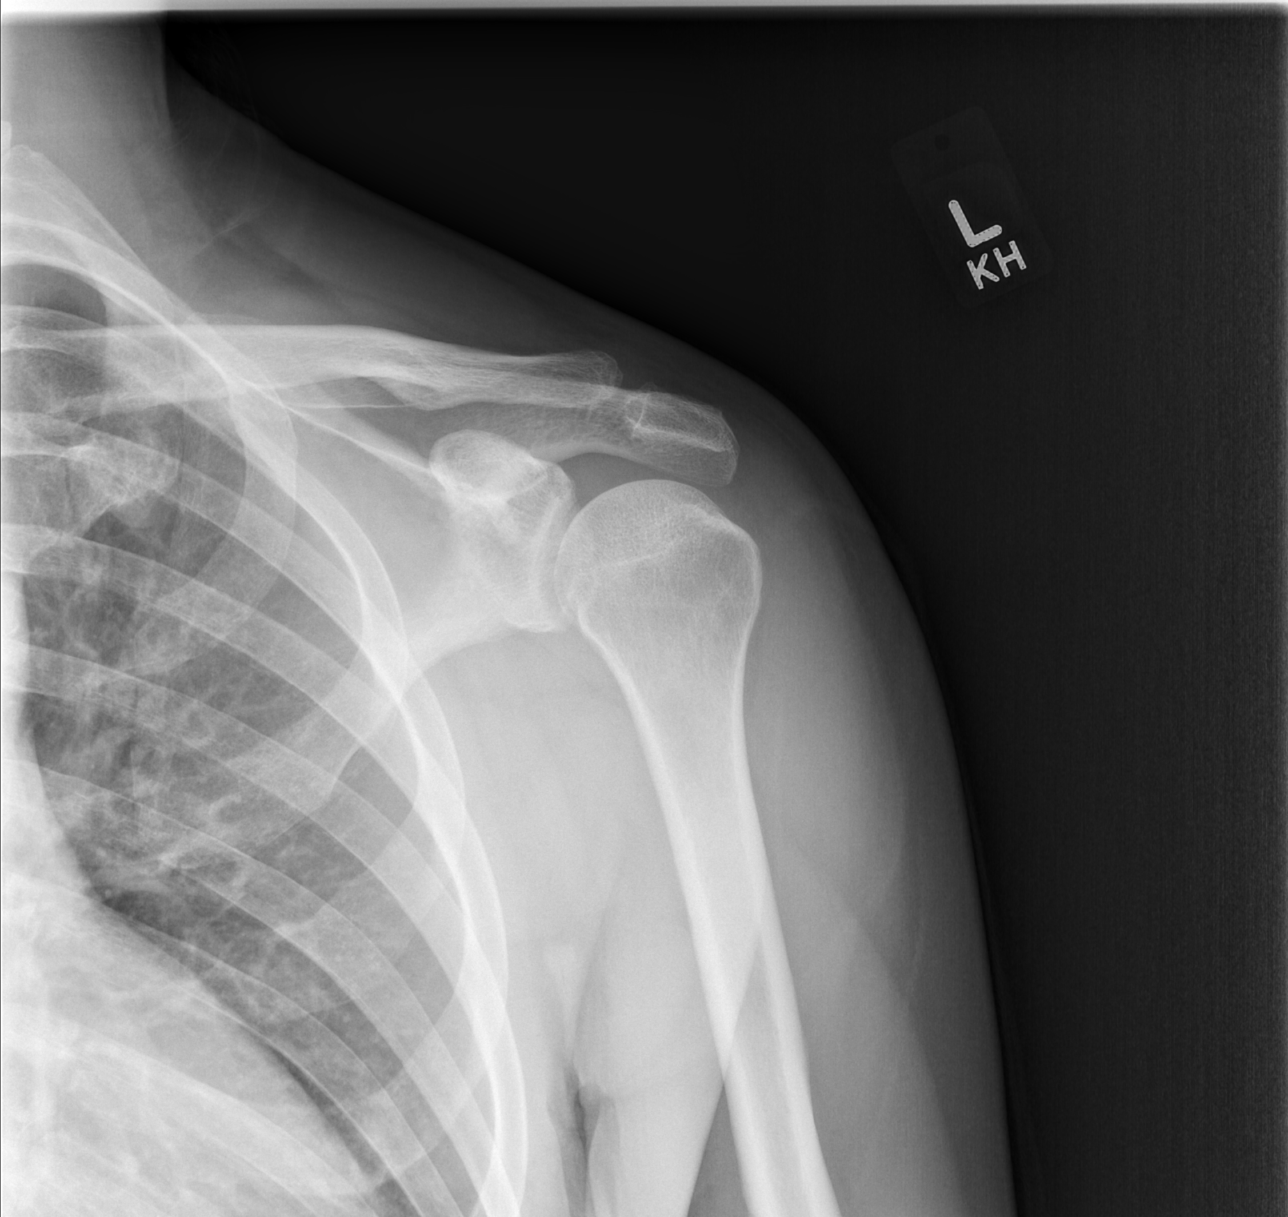

[w shoulder y view left]
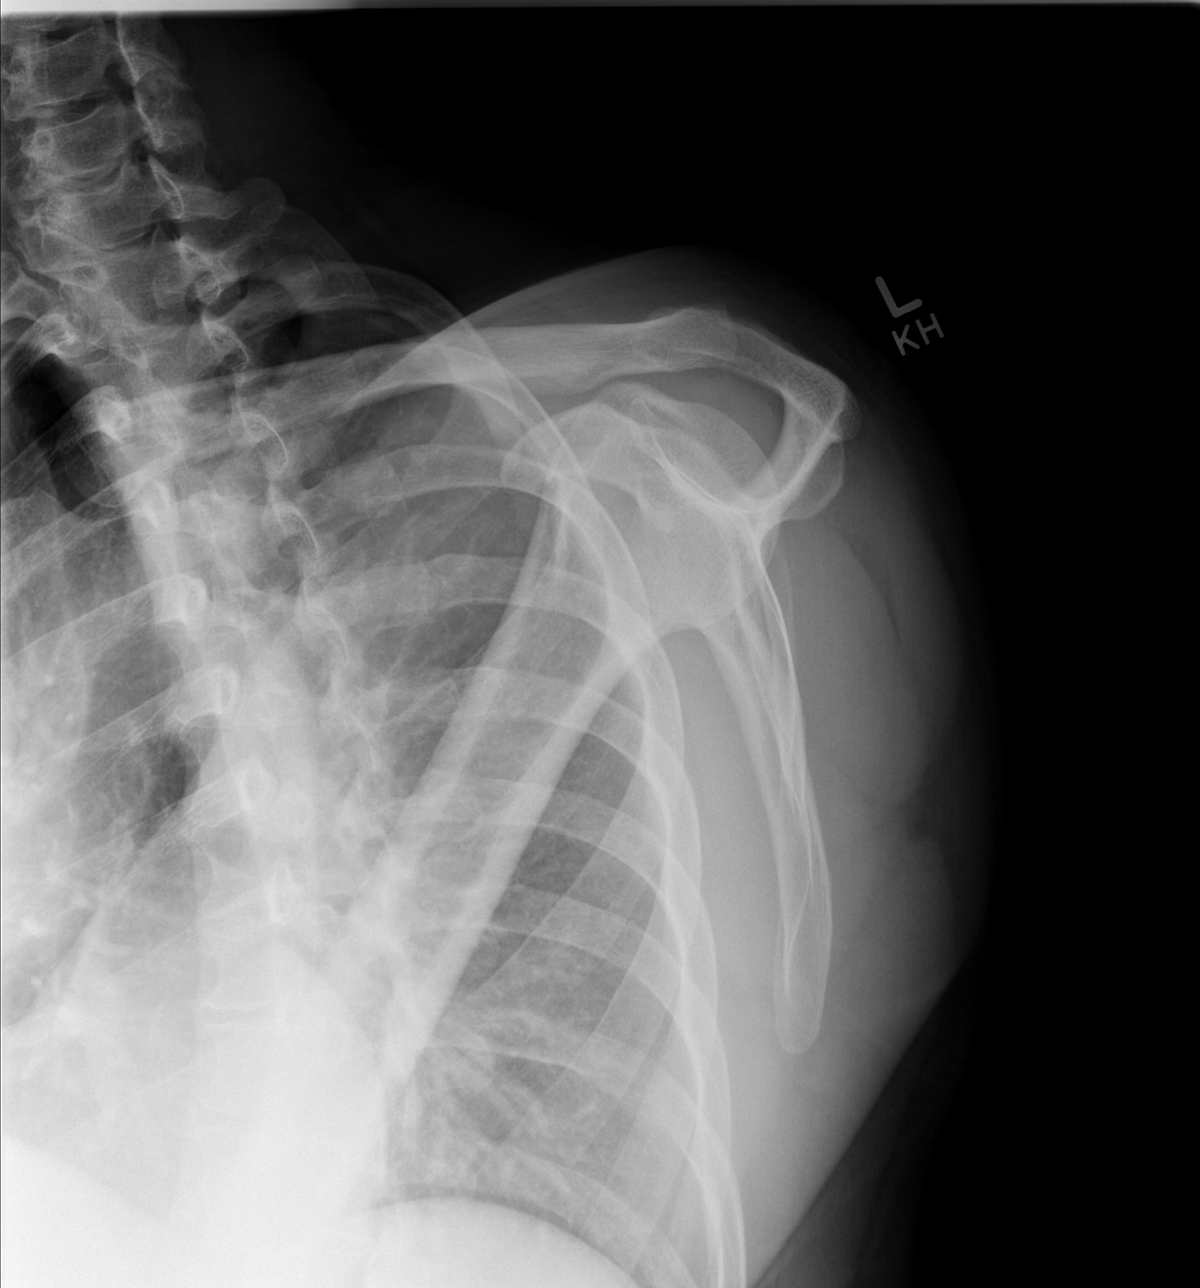

[x shoulder axillary left]
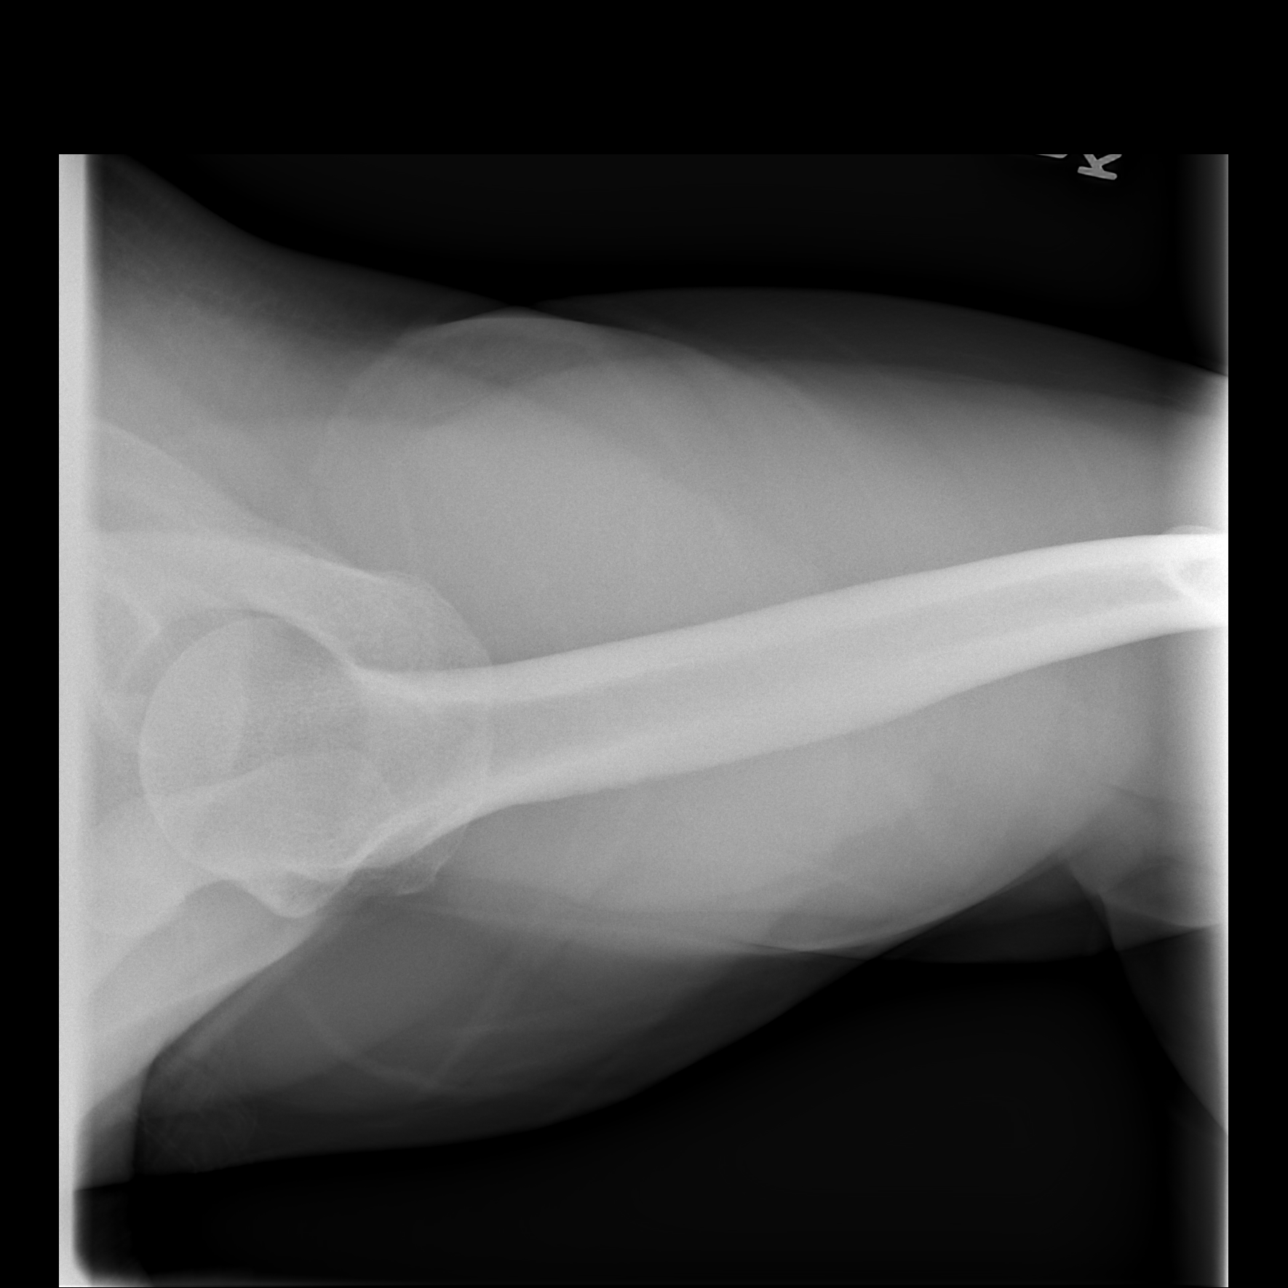

[3 of 3 positions shown; findings below may reference images not displayed]

FINDINGS: Degenerative changes in the AC joint with joint space narrowing and
spurring. Glenohumeral joint is maintained. No acute bony
abnormality. Specifically, no fracture, subluxation, or dislocation.
Soft tissues are intact.
IMPRESSION: Degenerative changes in the AC joint.  No acute bony abnormality.

## 2023-04-11 ENCOUNTER — Encounter: Payer: Self-pay | Admitting: Orthopaedic Surgery

## 2023-10-07 ENCOUNTER — Emergency Department (HOSPITAL_BASED_OUTPATIENT_CLINIC_OR_DEPARTMENT_OTHER)

## 2023-10-07 ENCOUNTER — Encounter (HOSPITAL_BASED_OUTPATIENT_CLINIC_OR_DEPARTMENT_OTHER): Payer: Self-pay | Admitting: *Deleted

## 2023-10-07 ENCOUNTER — Other Ambulatory Visit: Payer: Self-pay

## 2023-10-07 ENCOUNTER — Inpatient Hospital Stay (HOSPITAL_BASED_OUTPATIENT_CLINIC_OR_DEPARTMENT_OTHER)
Admission: EM | Admit: 2023-10-07 | Discharge: 2023-10-09 | DRG: 389 | Disposition: A | Discharge status: Home / Self Care | Attending: Internal Medicine | Admitting: Internal Medicine

## 2023-10-07 DIAGNOSIS — J45901 Unspecified asthma with (acute) exacerbation: Secondary | ICD-10-CM | POA: Diagnosis present

## 2023-10-07 DIAGNOSIS — F1120 Opioid dependence, uncomplicated: Secondary | ICD-10-CM | POA: Diagnosis present

## 2023-10-07 DIAGNOSIS — K567 Ileus, unspecified: Principal | ICD-10-CM | POA: Diagnosis present

## 2023-10-07 DIAGNOSIS — D72829 Elevated white blood cell count, unspecified: Secondary | ICD-10-CM | POA: Diagnosis present

## 2023-10-07 DIAGNOSIS — F1910 Other psychoactive substance abuse, uncomplicated: Secondary | ICD-10-CM | POA: Diagnosis present

## 2023-10-07 DIAGNOSIS — Z880 Allergy status to penicillin: Secondary | ICD-10-CM

## 2023-10-07 DIAGNOSIS — F419 Anxiety disorder, unspecified: Secondary | ICD-10-CM | POA: Diagnosis present

## 2023-10-07 DIAGNOSIS — R112 Nausea with vomiting, unspecified: Secondary | ICD-10-CM | POA: Diagnosis present

## 2023-10-07 DIAGNOSIS — Z79899 Other long term (current) drug therapy: Secondary | ICD-10-CM | POA: Diagnosis not present

## 2023-10-07 DIAGNOSIS — G8929 Other chronic pain: Secondary | ICD-10-CM | POA: Diagnosis present

## 2023-10-07 DIAGNOSIS — Z791 Long term (current) use of non-steroidal anti-inflammatories (NSAID): Secondary | ICD-10-CM | POA: Diagnosis not present

## 2023-10-07 DIAGNOSIS — F17210 Nicotine dependence, cigarettes, uncomplicated: Secondary | ICD-10-CM | POA: Diagnosis present

## 2023-10-07 DIAGNOSIS — K56609 Unspecified intestinal obstruction, unspecified as to partial versus complete obstruction: Secondary | ICD-10-CM | POA: Diagnosis present

## 2023-10-07 DIAGNOSIS — M5412 Radiculopathy, cervical region: Secondary | ICD-10-CM | POA: Diagnosis present

## 2023-10-07 DIAGNOSIS — Z888 Allergy status to other drugs, medicaments and biological substances status: Secondary | ICD-10-CM

## 2023-10-07 LAB — COMPREHENSIVE METABOLIC PANEL WITH GFR
ALT: 23 U/L (ref 0–44)
AST: 24 U/L (ref 15–41)
Albumin: 4.4 g/dL (ref 3.5–5.0)
Alkaline Phosphatase: 75 U/L (ref 38–126)
Anion gap: 15 (ref 5–15)
BUN: 17 mg/dL (ref 6–20)
CO2: 24 mmol/L (ref 22–32)
Calcium: 9.8 mg/dL (ref 8.9–10.3)
Chloride: 101 mmol/L (ref 98–111)
Creatinine, Ser: 0.96 mg/dL (ref 0.61–1.24)
GFR, Estimated: >60 mL/min (ref >60)
Glucose, Bld: 139 mg/dL — ABNORMAL HIGH (ref 70–99)
Potassium: 4.3 mmol/L (ref 3.5–5.1)
Sodium: 140 mmol/L (ref 135–145)
Total Bilirubin: 0.4 mg/dL (ref 0.0–1.2)
Total Protein: 7.4 g/dL (ref 6.5–8.1)

## 2023-10-07 LAB — CBC
HCT: 47.7 % (ref 39.0–52.0)
Hemoglobin: 16.4 g/dL (ref 13.0–17.0)
MCH: 30.4 pg (ref 26.0–34.0)
MCHC: 34.4 g/dL (ref 30.0–36.0)
MCV: 88.3 fL (ref 80.0–100.0)
Platelets: 337 10*3/uL (ref 150–400)
RBC: 5.4 MIL/uL (ref 4.22–5.81)
RDW: 13.1 % (ref 11.5–15.5)
WBC: 15 10*3/uL — ABNORMAL HIGH (ref 4.0–10.5)
nRBC: 0 % (ref 0.0–0.2)

## 2023-10-07 LAB — URINALYSIS, MICROSCOPIC (REFLEX)

## 2023-10-07 LAB — URINALYSIS, ROUTINE W REFLEX MICROSCOPIC
Bilirubin Urine: NEGATIVE
Glucose, UA: NEGATIVE mg/dL
Ketones, ur: NEGATIVE mg/dL
Leukocytes,Ua: NEGATIVE
Nitrite: NEGATIVE
Protein, ur: 100 mg/dL — AB
Specific Gravity, Urine: >=1.03 (ref 1.005–1.030)
pH: 6.5 (ref 5.0–8.0)

## 2023-10-07 LAB — LIPASE, BLOOD: Lipase: 16 U/L (ref 11–51)

## 2023-10-07 MED ORDER — ONDANSETRON 4 MG PO TBDP: 4.0000 mg | ORAL_TABLET | Freq: Once | ORAL | Status: DC | PRN

## 2023-10-07 MED ORDER — SODIUM CHLORIDE 0.9 % IV BOLUS
1000.0000 mL | Freq: Once | INTRAVENOUS | Status: AC
  Administered 2023-10-07: 1000 mL via INTRAVENOUS

## 2023-10-07 MED ORDER — IOHEXOL 300 MG/ML  SOLN
100.0000 mL | Freq: Once | INTRAMUSCULAR | Status: AC | PRN
  Administered 2023-10-07: 100 mL via INTRAVENOUS

## 2023-10-07 MED ORDER — HYDROMORPHONE HCL 1 MG/ML IJ SOLN
1.0000 mg | Freq: Once | INTRAMUSCULAR | Status: AC
  Administered 2023-10-07: 1 mg via INTRAVENOUS
  Filled 2023-10-07: qty 1

## 2023-10-07 MED ORDER — ONDANSETRON HCL 4 MG/2ML IJ SOLN
4.0000 mg | Freq: Once | INTRAMUSCULAR | Status: AC
Start: 2023-10-07 — End: 2023-10-07
  Administered 2023-10-07: 4 mg via INTRAVENOUS
  Filled 2023-10-07: qty 2

## 2023-10-07 MED ORDER — ONDANSETRON HCL 4 MG/2ML IJ SOLN
4.0000 mg | Freq: Once | INTRAMUSCULAR | Status: AC
  Administered 2023-10-07: 4 mg via INTRAVENOUS
  Filled 2023-10-07: qty 2

## 2023-10-07 NOTE — ED Provider Notes (Signed)
 [facility] Provider Note   CSN: [id number] Arrival date & time: 10/07/23  1625     Patient presents with: Vomiting and Abdominal Pain   Wesley Larson. is a 48 y.o. male.   Patient with history of chronic pain, on 7.5 mg oxycodone  4x/day, no previous abdominal surgeries --presents to the emergency department today for evaluation of acute onset of abdominal pain and vomiting.  Patient awoke with symptoms this morning.  He has had persistent vomiting, unable to keep down solids or liquids.  He describes a burning pain in the upper abdomen.  He had 1 formed bowel movement, no diarrhea.  He is urinating normally.  No chest pain or shortness of breath.  No fevers.  No suspicious food or water exposures.  States that he has been unable to hold down medication today.  Denies history of chronic constipation from opioids.       Prior to Admission medications   Medication Sig Start Date End Date Taking? Authorizing Provider  acetaminophen -codeine  (TYLENOL  #3) 300-30 MG tablet Take 1 tablet by mouth 2 (two) times daily as needed for moderate pain. 08/08/22   Sandie Cross, PA-C  albuterol  (PROVENTIL  HFA;VENTOLIN  HFA) 108 (90 Base) MCG/ACT inhaler Inhale 1-2 puffs into the lungs every 4 (four) hours as needed for wheezing or shortness of breath (or cough). 09/03/17   Street, [place], PA-C  gabapentin  (NEURONTIN ) 300 MG capsule Take 1 capsule (300 mg total) by mouth 3 (three) times daily. 10/05/21   Margaree Shark, MD  HYDROcodone -acetaminophen  (NORCO) 5-325 MG tablet Take 1 tablet by mouth daily as needed for moderate pain. 07/18/22   Wilhelmenia Harada, MD  HYDROmorphone  (DILAUDID ) 2 MG tablet Take 1 tablet (2 mg total) by mouth every 4 (four) hours as needed for severe pain. 05/11/22   Wes Hamman, MD  ibuprofen  (ADVIL ) 600 MG tablet Take 1 tablet (600 mg total) by mouth every 6 (six) hours as needed. 09/11/19   Debbra Fairy, PA-C  ketorolac   (TORADOL ) 10 MG tablet Take 1 tablet (10 mg total) by mouth 2 (two) times daily as needed. 05/11/22   Wes Hamman, MD  meloxicam  (MOBIC ) 15 MG tablet Take 1 tablet (15 mg total) by mouth daily. 05/14/22   Wilhelmenia Harada, MD  methocarbamol  (ROBAXIN ) 750 MG tablet Take 1 tablet (750 mg total) by mouth 2 (two) times daily as needed for muscle spasms. 07/25/22   Sandie Cross, PA-C  Multiple Vitamin (MULTIVITAMIN WITH MINERALS) TABS tablet Take 1 tablet by mouth daily.    [provider]  ondansetron  (ZOFRAN ) 4 MG tablet Take 1-2 tablets (4-8 mg total) by mouth every 8 (eight) hours as needed for nausea or vomiting. 05/10/22   Wes Hamman, MD  oxyCODONE  (ROXICODONE ) 5 MG immediate release tablet Take 1 tablet (5 mg total) by mouth every 6 (six) hours as needed for severe pain or breakthrough pain. 05/18/22   Sandie Cross, PA-C  oxyCODONE -acetaminophen  (PERCOCET) 5-325 MG tablet Take 1-2 tablets by mouth 2 (two) times daily as needed for severe pain. 05/10/22   Wes Hamman, MD  oxyCODONE -acetaminophen  (PERCOCET) 5-325 MG tablet Take 1 tablet by mouth 2 (two) times daily as needed. 05/25/22   Sandie Cross, PA-C  oxyCODONE -acetaminophen  (PERCOCET) 5-325 MG tablet Take 1 tablet by mouth daily as needed for severe pain. 06/09/22   Wes Hamman, MD  predniSONE  (DELTASONE ) 5 MG tablet Take 6 pills for first day, 5  pills second day, 4 pills third day, 3 pills fourth day, 2 pills the fifth day, and 1 pill sixth day. 10/05/21   Margaree Shark, MD  traMADol  (ULTRAM ) 50 MG tablet Take 1-2 tablets (50-100 mg total) by mouth 2 (two) times daily. 06/27/22   Sandie Cross, PA-C    Allergies: Penicillins    Review of Systems  Updated Vital Signs BP (!) 162/105 (BP Location: Left Arm)   Pulse 95   Temp 97.7 F (36.5 C)   Resp 20   SpO2 98%   Physical Exam Vitals and nursing note reviewed.  Constitutional:      General: He is not in acute distress.    Appearance: He is well-developed.   HENT:     Head: Normocephalic and atraumatic.   Eyes:     General:        Right eye: No discharge.        Left eye: No discharge.     Conjunctiva/sclera: Conjunctivae normal.    Cardiovascular:     Rate and Rhythm: Normal rate and regular rhythm.     Heart sounds: Normal heart sounds.  Pulmonary:     Effort: Pulmonary effort is normal.     Breath sounds: Normal breath sounds.  Abdominal:     Palpations: Abdomen is soft.     Tenderness: There is abdominal tenderness in the right lower quadrant, epigastric area, periumbilical area, suprapubic area and left lower quadrant. There is no guarding or rebound. Negative signs include Murphy's sign and McBurney's sign.     Comments: Nonfocal abdominal exam, but patient does wince with palpation in the epigastrium, periumbilical area and right lower quadrant.   Musculoskeletal:     Cervical back: Normal range of motion and neck supple.   Skin:    General: Skin is warm and dry.   Neurological:     Mental Status: He is alert.    ED Course  Patient seen and examined. History obtained directly from patient.   Labs/EKG: Ordered CBC, CMP, lipase, UA  Imaging: Ordered CT abdomen pelvis.  Medications/Fluids: Ordered: IV Dilaudid , IV Zofran , IV fluid bolus.   Most recent vital signs reviewed and are as follows: BP (!) 162/105 (BP Location: Left Arm)   Pulse 95   Temp 97.7 F (36.5 C)   Resp 20   SpO2 98%   Initial impression: Patient with abdominal pain and vomiting.  Abrupt onset.  He has more pain and less diarrhea than I would expect with typical gastroenteritis.  For this reason, we will proceed with imaging.  6:35 PM there is currently a delay in obtaining chemistries due to the analyzer being disabled.  White blood cell count is elevated at 15,000 otherwise CBC is normal.  UA without signs of infection.  Signout to [employer] at shift change, pending CT imaging.    (all labs ordered are listed, but only abnormal results  are displayed) Labs Reviewed  CBC - Abnormal; Notable for the following components:      Result Value   WBC 15.0 (*)    All other components within normal limits  URINALYSIS, ROUTINE W REFLEX MICROSCOPIC - Abnormal; Notable for the following components:   Hgb urine dipstick SMALL (*)    Protein, ur 100 (*)    All other components within normal limits  URINALYSIS, MICROSCOPIC (REFLEX) - Abnormal; Notable for the following components:   Bacteria, UA RARE (*)    All other components within normal limits  LIPASE, BLOOD  COMPREHENSIVE METABOLIC PANEL WITH GFR    EKG: None  Radiology: No results found.   Procedures   Medications Ordered in the ED  ondansetron  (ZOFRAN -ODT) disintegrating tablet 4 mg (has no administration in time range)  sodium chloride  0.9 % bolus 1,000 mL (has no administration in time range)  ondansetron  (ZOFRAN ) injection 4 mg (has no administration in time range)  HYDROmorphone  (DILAUDID ) injection 1 mg (has no administration in time range)                                    Medical Decision Making Amount and/or Complexity of Data Reviewed Labs: ordered.  Risk Prescription drug management.   For this patient's complaint of abdominal pain, the following conditions were considered on the differential diagnosis: gastritis/PUD, enteritis/duodenitis, appendicitis, cholelithiasis/cholecystitis, cholangitis, pancreatitis, ruptured viscus, colitis, diverticulitis, small/large bowel obstruction, proctitis, cystitis, pyelonephritis, ureteral colic, aortic dissection, aortic aneurysm. Atypical chest etiologies were also considered including ACS, PE, and pneumonia.      Final diagnoses:  None    ED Discharge Orders     None          Lyna Sandhoff, Kirby Peoples 10/07/23 1848    Dalene Duck, MD 10/07/23 [phone]

## 2023-10-07 NOTE — ED Triage Notes (Signed)
 Pt has been having vomiting since 5am.  Pt is having dry heaves in triage.  Pt states that he thinks he has food poisoning.

## 2023-10-07 NOTE — Progress Notes (Signed)
 Pt has arrived to unit, via stretcher with [facility] at side. Pt is warm, dry,no visible distress.

## 2023-10-07 NOTE — Progress Notes (Signed)
 Placed call to [facility] patient placement at [phone]. They returned call. Informed them that the pt has arrived to room 1303 from [facility]. Nurse was told that they would page the doctor.

## 2023-10-07 NOTE — ED Provider Notes (Signed)
 Accepted handoff at shift change from [place], PA-C. Please see prior provider note for more detail.   Briefly: Patient is 48 y.o.   DDX: concern for constipation, bowel obstruction, ileus, diverticulitis  Plan: Disposition per results of CT imaging and symptomatic response.  Physical Exam  BP 124/78   Pulse 65   Temp 97.7 F (36.5 C)   Resp 18   SpO2 97%   Physical Exam Vitals and nursing note reviewed.  Constitutional:      General: He is not in acute distress.    Appearance: He is well-developed. He is ill-appearing.  HENT:     Head: Normocephalic and atraumatic.   Eyes:     General:        Right eye: No discharge.        Left eye: No discharge.     Conjunctiva/sclera: Conjunctivae normal.    Cardiovascular:     Rate and Rhythm: Normal rate and regular rhythm.     Heart sounds: Normal heart sounds.  Pulmonary:     Effort: Pulmonary effort is normal.     Breath sounds: Normal breath sounds.  Abdominal:     Palpations: Abdomen is soft.     Tenderness: There is abdominal tenderness in the right lower quadrant, epigastric area, periumbilical area, suprapubic area and left lower quadrant. There is no guarding or rebound. Negative signs include Murphy's sign and McBurney's sign.     Comments: Nonfocal abdominal exam, but patient does wince with palpation in the epigastrium, periumbilical area and right lower quadrant.   Musculoskeletal:     Cervical back: Normal range of motion and neck supple.   Skin:    General: Skin is warm and dry.   Neurological:     Mental Status: He is alert.     Procedures  Procedures  ED Course / MDM    Medical Decision Making Amount and/or Complexity of Data Reviewed Labs: ordered. Radiology: ordered.  Risk Prescription drug management. Decision regarding hospitalization.   Patient is a handoff from [place], PA-C.  In short, patient presents to the emergency department today with concerns of generalized abdominal pain with  onset this morning with intractable vomiting.  Not tolerating p.o.  Exam was consistent with diffuse abdominal tenderness.  Bowel sounds appropriate.  Pending CT imaging at this time.  Has received Dilaudid , fluids, and Zofran  with some improvement in symptoms with still not tolerating p.o.  CT abdomen pelvis is concerning for possible ileus versus early obstruction.  Possible source of ileus would be opiate induced as patient does report taking oxycodone  7.5 mg 4 times daily.  Given possible early obstruction, will consult with general surgery and likely admission as patient still not tolerating p.o.  Spoke with Dr. Cherlynn Cornfield, advised medicine admission and general surgery will consult. Planned for conservative management at this time.  Spoke with Dr. [facility] Lavender, hospitalist, who will be admitting patient.   Kylil Swopes A, PA-C 10/07/23 2314    Dalene Duck, MD 10/07/23 [phone]

## 2023-10-08 ENCOUNTER — Inpatient Hospital Stay (HOSPITAL_COMMUNITY)

## 2023-10-08 DIAGNOSIS — J45901 Unspecified asthma with (acute) exacerbation: Secondary | ICD-10-CM

## 2023-10-08 DIAGNOSIS — R112 Nausea with vomiting, unspecified: Secondary | ICD-10-CM

## 2023-10-08 DIAGNOSIS — K56609 Unspecified intestinal obstruction, unspecified as to partial versus complete obstruction: Secondary | ICD-10-CM | POA: Diagnosis not present

## 2023-10-08 DIAGNOSIS — K567 Ileus, unspecified: Principal | ICD-10-CM

## 2023-10-08 LAB — PHOSPHORUS: Phosphorus: 1.7 mg/dL — ABNORMAL LOW (ref 2.5–4.6)

## 2023-10-08 LAB — CBC
HCT: 48.3 % (ref 39.0–52.0)
Hemoglobin: 15.8 g/dL (ref 13.0–17.0)
MCH: 30 pg (ref 26.0–34.0)
MCHC: 32.7 g/dL (ref 30.0–36.0)
MCV: 91.8 fL (ref 80.0–100.0)
Platelets: 314 10*3/uL (ref 150–400)
RBC: 5.26 MIL/uL (ref 4.22–5.81)
RDW: 13.2 % (ref 11.5–15.5)
WBC: 9.7 10*3/uL (ref 4.0–10.5)
nRBC: 0 % (ref 0.0–0.2)

## 2023-10-08 LAB — BASIC METABOLIC PANEL WITH GFR
Anion gap: 9 (ref 5–15)
BUN: 15 mg/dL (ref 6–20)
CO2: 26 mmol/L (ref 22–32)
Calcium: 9.1 mg/dL (ref 8.9–10.3)
Chloride: 102 mmol/L (ref 98–111)
Creatinine, Ser: 0.79 mg/dL (ref 0.61–1.24)
GFR, Estimated: >60 mL/min (ref >60)
Glucose, Bld: 160 mg/dL — ABNORMAL HIGH (ref 70–99)
Potassium: 4 mmol/L (ref 3.5–5.1)
Sodium: 137 mmol/L (ref 135–145)

## 2023-10-08 LAB — MAGNESIUM: Magnesium: 2 mg/dL (ref 1.7–2.4)

## 2023-10-08 MED ORDER — METHYLPREDNISOLONE SODIUM SUCC 40 MG IJ SOLR
40.0000 mg | Freq: Every day | INTRAMUSCULAR | Status: DC
  Administered 2023-10-08 – 2023-10-09 (×2): 40 mg via INTRAVENOUS
  Filled 2023-10-08 (×2): qty 1

## 2023-10-08 MED ORDER — HYDROMORPHONE HCL 1 MG/ML IJ SOLN
1.0000 mg | INTRAMUSCULAR | Status: DC | PRN
  Administered 2023-10-08 – 2023-10-09 (×7): 1 mg via INTRAVENOUS
  Filled 2023-10-08 (×7): qty 1

## 2023-10-08 MED ORDER — IPRATROPIUM-ALBUTEROL 0.5-2.5 (3) MG/3ML IN SOLN
3.0000 mL | RESPIRATORY_TRACT | Status: DC | PRN
  Administered 2023-10-08 – 2023-10-09 (×3): 3 mL via RESPIRATORY_TRACT
  Filled 2023-10-08 (×2): qty 3

## 2023-10-08 MED ORDER — ENOXAPARIN SODIUM 40 MG/0.4ML IJ SOSY
40.0000 mg | PREFILLED_SYRINGE | INTRAMUSCULAR | Status: DC
Start: 2023-10-08 — End: 2023-10-09
  Filled 2023-10-08: qty 0.4

## 2023-10-08 MED ORDER — OXYCODONE-ACETAMINOPHEN 5-325 MG PO TABS
1.0000 | ORAL_TABLET | ORAL | Status: DC | PRN
  Administered 2023-10-09: 1 via ORAL
  Filled 2023-10-08: qty 1

## 2023-10-08 MED ORDER — SENNOSIDES-DOCUSATE SODIUM 8.6-50 MG PO TABS: 1.0000 | ORAL_TABLET | Freq: Every evening | ORAL | Status: DC | PRN

## 2023-10-08 MED ORDER — ONDANSETRON HCL 4 MG PO TABS: 4.0000 mg | ORAL_TABLET | Freq: Four times a day (QID) | ORAL | Status: DC | PRN

## 2023-10-08 MED ORDER — ACETAMINOPHEN 325 MG PO TABS: 650.0000 mg | ORAL_TABLET | Freq: Four times a day (QID) | ORAL | Status: DC | PRN

## 2023-10-08 MED ORDER — HYDROMORPHONE HCL 1 MG/ML IJ SOLN: 1.0000 mg | Freq: Four times a day (QID) | INTRAMUSCULAR | Status: DC | PRN

## 2023-10-08 MED ORDER — ROSUVASTATIN CALCIUM 5 MG PO TABS
5.0000 mg | ORAL_TABLET | Freq: Every day | ORAL | Status: AC
  Administered 2023-10-08 – 2023-10-09 (×2): 5 mg via ORAL
  Filled 2023-10-08 (×2): qty 1

## 2023-10-08 MED ORDER — ALBUTEROL SULFATE (2.5 MG/3ML) 0.083% IN NEBU: 3.0000 mL | INHALATION_SOLUTION | Freq: Four times a day (QID) | RESPIRATORY_TRACT | Status: DC | PRN

## 2023-10-08 MED ORDER — OXYCODONE HCL 5 MG PO TABS
2.5000 mg | ORAL_TABLET | ORAL | Status: DC | PRN
  Administered 2023-10-09: 2.5 mg via ORAL
  Filled 2023-10-08: qty 1

## 2023-10-08 MED ORDER — POLYETHYLENE GLYCOL 3350 17 G PO PACK
17.0000 g | PACK | Freq: Two times a day (BID) | ORAL | Status: DC
  Administered 2023-10-08: 17 g via ORAL
  Filled 2023-10-08 (×2): qty 1

## 2023-10-08 MED ORDER — ONDANSETRON HCL 4 MG/2ML IJ SOLN: 4.0000 mg | Freq: Four times a day (QID) | INTRAMUSCULAR | Status: DC | PRN

## 2023-10-08 MED ORDER — OXYCODONE-ACETAMINOPHEN 7.5-325 MG PO TABS: 1.0000 | ORAL_TABLET | ORAL | Status: DC | PRN

## 2023-10-08 MED ORDER — ACETAMINOPHEN 650 MG RE SUPP: 650.0000 mg | Freq: Four times a day (QID) | RECTAL | Status: DC | PRN

## 2023-10-08 MED ORDER — SODIUM CHLORIDE 0.9 % IV SOLN: INTRAVENOUS | Status: DC

## 2023-10-08 MED ORDER — MAGNESIUM HYDROXIDE 400 MG/5ML PO SUSP
30.0000 mL | Freq: Once | ORAL | Status: AC
  Administered 2023-10-08: 30 mL via ORAL
  Filled 2023-10-08: qty 30

## 2023-10-08 MED ORDER — NICOTINE 21 MG/24HR TD PT24
21.0000 mg | MEDICATED_PATCH | Freq: Every day | TRANSDERMAL | Status: DC
  Administered 2023-10-08 – 2023-10-09 (×2): 21 mg via TRANSDERMAL
  Filled 2023-10-08 (×2): qty 1

## 2023-10-08 NOTE — Consult Note (Signed)
 Consult Note  Wesley Larson. 1975/10/12  [id number].    Requesting MD: Redge Cancel, MD Chief Complaint/Reason for Consult: SBO HPI:  Patient is a 48 year old male with PMH significant for asthma, polysubstance abuse, chronic pain and cervical radiculopathy who presented to [facility] yesterday with nausea, vomiting and abdominal pain. Saturday evening he ate a large bag of raw vegetables and woke up around 5 AM yesterday with onset of symptoms. He had 1 small BM in the morning. No prior abdominal surgery. He generally has a BM daily and does not require any bowel regimen to do so. 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 Denies similar presentation previously. He is currently passing flatus and denies significant abdominal pain. He was no longer having nausea or vomiting prior to NGT placement.   ROS: Negative other than HPI  History reviewed. No pertinent family history.  Past Medical History:  Diagnosis Date   Asthma     Past Surgical History:  Procedure Laterality Date   SHOULDER ARTHROSCOPY WITH ROTATOR CUFF REPAIR AND SUBACROMIAL DECOMPRESSION Left 05/10/2022   Procedure: LEFT SHOULDER ARTHROSCOPY, ROTATOR CUFF REPAIR, EXTENSIVE DEBRIDEMENT, BICEPS TENODESIS AND SUBACROMIAL DECOMPRESSION;  Surgeon: Wes Hamman, MD;  Location: [facility];  Service: Orthopedics;  Laterality: Left;    Social History:  reports that he has been smoking cigarettes. He has never used smokeless tobacco. He reports current drug use. Drug: Marijuana. He reports that he does not drink alcohol.  Allergies:  Allergies  Allergen Reactions   Ondansetron  Other (See Comments)    Stomach Issues    Penicillins Other (See Comments)    Pt doesn't know the reaction, had it as a child.    Medications Prior to Admission  Medication Sig Dispense Refill   albuterol  (PROVENTIL  HFA;VENTOLIN  HFA) 108 (90 Base) MCG/ACT  inhaler Inhale 1-2 puffs into the lungs every 4 (four) hours as needed for wheezing or shortness of breath (or cough). (Patient taking differently: Inhale 1-2 puffs into the lungs daily.) 1 Inhaler 0   albuterol  (PROVENTIL ) (2.5 MG/3ML) 0.083% nebulizer solution Take 2.5 mg by nebulization every 6 (six) hours as needed for wheezing or shortness of breath.     hydrOXYzine (ATARAX) 25 MG tablet Take 25 mg by mouth See admin instructions. Take 25mg  (1 tablet) by mouth two to three times daily.     oxyCODONE -acetaminophen  (PERCOCET) 7.5-325 MG tablet Take 1 tablet by mouth See admin instructions. Take 1 tablet by mouth four to six times a day     predniSONE  (DELTASONE ) 20 MG tablet Take 20 mg by mouth at bedtime. Until finished for 2 weeks.     rosuvastatin (CRESTOR) 5 MG tablet Take 5 mg by mouth at bedtime.     Vitamin D, Ergocalciferol, (DRISDOL) 1.25 MG (50000 UNIT) CAPS capsule Take 50,000 Units by mouth once a week.     ipratropium-albuterol  (DUONEB) 0.5-2.5 (3) MG/3ML SOLN Take 3 mLs by nebulization every 4 (four) hours as needed (Shortness of breath, wheezing). (Patient not taking: Reported on 10/08/2023)      Blood pressure (!) 144/82, pulse 63, temperature 98.1 F (36.7 C), temperature source Oral, resp. rate 16, height 5' 7 (1.702 m), weight 79.5 kg, SpO2 93%. Physical Exam:  General: pleasant, WD, WN male who is laying in bed in NAD HEENT: head is normocephalic, atraumatic.  Sclera are noninjected.  Pupils equal and round.  Ears  and nose without any masses or lesions.  Mouth is pink and moist Heart: regular, rate, and rhythm.   Lungs: No wheezes, rhonchi, or rales noted.  Respiratory effort nonlabored Abd: soft, NT, ND, NGT with very thin almost clear drainage in tubing MS: all 4 extremities are symmetrical with no cyanosis, clubbing, or edema. Skin: warm and dry with no masses, lesions, or rashes Neuro: Cranial nerves 2-12 grossly intact, sensation is normal throughout Psych: A&Ox3 with  an appropriate affect.   Results for orders placed or performed during the hospital encounter of 10/07/23 (from the past 48 hours)  Urinalysis, Routine w reflex microscopic -Urine, Clean Catch     Status: Abnormal   Collection Time: 10/07/23  4:34 PM  Result Value Ref Range   Color, Urine YELLOW YELLOW   APPearance CLEAR CLEAR   Specific Gravity, Urine >=1.030 1.005 - 1.030   pH 6.5 5.0 - 8.0   Glucose, UA NEGATIVE NEGATIVE mg/dL   Hgb urine dipstick SMALL (A) NEGATIVE   Bilirubin Urine NEGATIVE NEGATIVE   Ketones, ur NEGATIVE NEGATIVE mg/dL   Protein, ur 308 (A) NEGATIVE mg/dL   Nitrite NEGATIVE NEGATIVE   Leukocytes,Ua NEGATIVE NEGATIVE    Comment: Performed at [facility], [address] [place], [place] [address]  Urinalysis, Microscopic (reflex)     Status: Abnormal   Collection Time: 10/07/23  4:34 PM  Result Value Ref Range   RBC / HPF 6-10 0 - 5 RBC/hpf   WBC, UA 0-5 0 - 5 WBC/hpf   Bacteria, UA RARE (A) NONE SEEN   Squamous Epithelial / HPF 0-5 0 - 5 /HPF    Comment: Performed at [facility], [address] [place], [place] [address]  Lipase, blood     Status: None   Collection Time: 10/07/23  4:39 PM  Result Value Ref Range   Lipase 16 11 - 51 U/L    Comment: Performed at [facility], [address] [place], [place] [address]  Comprehensive metabolic panel     Status: Abnormal   Collection Time: 10/07/23  4:39 PM  Result Value Ref Range   Sodium 140 135 - 145 mmol/L   Potassium 4.3 3.5 - 5.1 mmol/L   Chloride 101 98 - 111 mmol/L   CO2 24 22 - 32 mmol/L   Glucose, Bld 139 (H) 70 - 99 mg/dL    Comment: Glucose reference range applies only to samples taken after fasting for at least 8 hours.   BUN 17 6 - 20 mg/dL   Creatinine, Ser 3.24 0.61 - 1.24 mg/dL   Calcium 9.8 8.9 - 40.1 mg/dL   Total Protein 7.4 6.5 - 8.1 g/dL   Albumin 4.4 3.5 - 5.0 g/dL   AST 24 15 - 41 U/L   ALT 23 0 - 44 U/L   Alkaline Phosphatase 75 38 - 126  U/L   Total Bilirubin 0.4 0.0 - 1.2 mg/dL   GFR, Estimated >02 >72 mL/min    Comment: (NOTE) Calculated using the CKD-EPI Creatinine Equation (2021)    Anion gap 15 5 - 15    Comment: Performed at [facility], [address] [place], [place] [address]  CBC     Status: Abnormal   Collection Time: 10/07/23  4:39 PM  Result Value Ref Range   WBC 15.0 (H) 4.0 - 10.5 K/uL   RBC 5.40 4.22 - 5.81 MIL/uL   Hemoglobin 16.4 13.0 - 17.0  g/dL   HCT 16.1 09.6 - 04.5 %   MCV 88.3 80.0 - 100.0 fL   MCH 30.4 26.0 - 34.0 pg   MCHC 34.4 30.0 - 36.0 g/dL   RDW 40.9 81.1 - 91.4 %   Platelets 337 150 - 400 K/uL   nRBC 0.0 0.0 - 0.2 %    Comment: Performed at [facility], [address] [place], [place] [address]  CBC     Status: None   Collection Time: 10/08/23  4:27 AM  Result Value Ref Range   WBC 9.7 4.0 - 10.5 K/uL   RBC 5.26 4.22 - 5.81 MIL/uL   Hemoglobin 15.8 13.0 - 17.0 g/dL   HCT 62.1 30.8 - 65.7 %   MCV 91.8 80.0 - 100.0 fL   MCH 30.0 26.0 - 34.0 pg   MCHC 32.7 30.0 - 36.0 g/dL   RDW 84.6 96.2 - 95.2 %   Platelets 314 150 - 400 K/uL   nRBC 0.0 0.0 - 0.2 %    Comment: Performed at [facility], [address], [place], [place] [address]  Basic metabolic panel     Status: Abnormal   Collection Time: 10/08/23  4:27 AM  Result Value Ref Range   Sodium 137 135 - 145 mmol/L   Potassium 4.0 3.5 - 5.1 mmol/L   Chloride 102 98 - 111 mmol/L   CO2 26 22 - 32 mmol/L   Glucose, Bld 160 (H) 70 - 99 mg/dL    Comment: Glucose reference range applies only to samples taken after fasting for at least 8 hours.   BUN 15 6 - 20 mg/dL   Creatinine, Ser 4.40 0.61 - 1.24 mg/dL   Calcium 9.1 8.9 - 10.2 mg/dL   GFR, Estimated >72 >53 mL/min    Comment: (NOTE) Calculated using the CKD-EPI Creatinine Equation (2021)    Anion gap 9 5 - 15    Comment: Performed at [facility], [address], [place], [place] [address]  Magnesium      Status: None   Collection Time: 10/08/23  4:27 AM  Result Value Ref Range   Magnesium 2.0 1.7 - 2.4 mg/dL    Comment: Performed at [facility], [address] [place], [place] [address]  Phosphorus     Status: Abnormal   Collection Time: 10/08/23  4:27 AM  Result Value Ref Range   Phosphorus 1.7 (L) 2.5 - 4.6 mg/dL    Comment: Performed at [facility], [address], [place], [place] [address]   DG Abd Portable 1V Result Date: 10/08/2023 EXAM: 1 VIEW XRAY OF THE ABDOMEN SUPINE 10/08/2023 12:41:58 AM COMPARISON: 10/07/2023 CLINICAL HISTORY: [id number] Nasogastric tube present [id number]. NG tube placement FINDINGS: BOWEL: Nonobstructive bowel gas pattern. PERITONEUM AND SOFT TISSUES: No abnormal calcifications. BONES: No acute osseous abnormality. LINES AND TUBES: Enteric tube terminates in the gastric antrum. IMPRESSION: 1. Enteric tube terminates in the gastric antrum. Electronically signed by: Zadie Herter MD 10/08/2023 12:50 AM EDT RP Workstation: [id number]   DG Abd Portable 1 View Result Date: 10/07/2023 CLINICAL DATA:  Nasogastric tube placement. EXAM: PORTABLE ABDOMEN - 1 VIEW COMPARISON:  None Available. FINDINGS: An enteric tube is seen with its distal tip overlying the body of the stomach. The distal side hole sits just above the expected level of the gastroesophageal junction. The bowel gas pattern is normal. Radiopaque contrast is seen within the bilateral renal collecting systems. No radio-opaque calculi or other  significant radiographic abnormality are seen. IMPRESSION: Enteric tube positioning, as described above. Advancement of the enteric tube by approximately 5 cm is recommended to decrease the risk of aspiration. Electronically Signed   By: Virgle Grime M.D.   On: 10/07/2023 23:05   CT ABDOMEN PELVIS W CONTRAST Result Date: 10/07/2023 CLINICAL DATA:  Abdominal pain, vomiting EXAM: CT ABDOMEN AND PELVIS WITH CONTRAST TECHNIQUE:  Multidetector CT imaging of the abdomen and pelvis was performed using the standard protocol following bolus administration of intravenous contrast. RADIATION DOSE REDUCTION: This exam was performed according to the departmental dose-optimization program which includes automated exposure control, adjustment of the mA and/or kV according to patient size and/or use of iterative reconstruction technique. CONTRAST:  OMNIPAQUE IOHEXOL 300 MG/ML  SOLN COMPARISON:  09/11/2019 FINDINGS: Lower chest: No acute pleural or parenchymal lung disease. Hepatobiliary: No focal liver abnormality is seen. No gallstones, gallbladder wall thickening, or biliary dilatation. Pancreas: Unremarkable. No pancreatic ductal dilatation or surrounding inflammatory changes. Spleen: Normal in size without focal abnormality. Adrenals/Urinary Tract: Adrenal glands are unremarkable. Kidneys are normal, without renal calculi, focal lesion, or hydronephrosis. Bladder is unremarkable. Stomach/Bowel: There is mild dilation of the proximal jejunum measuring up to 3.8 cm in diameter. Gradual return to normal caliber of the distal small bowel with no focal transition point identified. Moderate gas and stool within the proximal colon. Normal appendix right lower quadrant. No bowel wall thickening or inflammatory change. Vascular/Lymphatic: No significant vascular findings are present. No enlarged abdominal or pelvic lymph nodes. Reproductive: Prostate is unremarkable. Other: No free fluid or free intraperitoneal gas. Small fat containing umbilical hernia. No bowel herniation. Musculoskeletal: No bowel obstruction or ileus. Reconstructed images demonstrate no additional findings. IMPRESSION: 1. Mild dilation of the proximal small bowel without abrupt transition point, which may reflect ileus or early obstruction. Continued radiographic follow-up recommended. Electronically Signed   By: Bobbye Burrow M.D.   On: 10/07/2023 19:45       Assessment/Plan Ileus vs constipation after ingestion large volume fiber - CT yesterday with mild dilation of proximal small bowel without abrupt transition point which may reflect ileus vs early obstruction, moderate gas and stool within proximal colon  - NGT was placed yesterday - film this AM shows normal bowel gas pattern and pt passing flatus - no prior abdominal surgery and pt denies symptoms prior to eating large helping of raw vegetables - removed NGT, recommend CLD and bowel regimen - ok to advance diet as tolerated - would not recommend surgical intervention, general surgery will sign off but please call if not improving or concerns arise  FEN: CLD, IVF per TRH, bowel regimen VTE: LMWH ID: none current   I reviewed ED provider notes, hospitalist notes, last 24 h vitals and pain scores, last 48 h intake and output, last 24 h labs and trends, and last 24 h imaging results.  This care required high  level of medical decision making.   Annetta Killian, [facility] 10/08/2023, 9:27 AM Please see Amion for pager number during day hours 7:00am-4:30pm

## 2023-10-08 NOTE — Progress Notes (Signed)
   10/08/23 1433  TOC Brief Assessment  Insurance and Status Reviewed  Patient has primary care physician No  Home environment has been reviewed home with spouse  Prior level of function: independent  Prior/Current Home Services No current home services  Social Drivers of Health Review SDOH reviewed no interventions necessary  Readmission risk has been reviewed Yes  Transition of care needs no transition of care needs at this time

## 2023-10-08 NOTE — H&P (Addendum)
 History and Physical  Wesley Larson. ZOX:[id number] DOB: November 19, 1975 DOA: 10/07/2023  PCP: Patient, No Pcp Per   Chief Complaint: Abdominal pain, N/V  HPI: Wesley Marcum. is a 48 y.o. male with medical history significant for asthma, polysubstance use disorder, chronic pain, and cervical radiculopathy who presented to med [facility] ED for evaluation of abdominal pain, nausea and vomiting. Patient reports that yesterday evening, he ate some raw vegetables. Around around 5 AM in the morning, he woke up with persistent nausea and vomiting as well as abdominal pain. Everything he ate or drank came up and his abdominal pain continued to progress.  He had 1 small bowel movement in the morning.  He reports a dry cough and wheezing but denies any shortness of breath, chest pain, diarrhea, constipation, dizziness or headache.  He denies any history of abdominal surgeries or bowel obstruction.   ED Course: Initial vitals show patient afebrile but hypertensive with SBP in the 130s to 160s. Initial labs significant for WBC 15.0, glucose 139, normal kidney function, lipase and LFTs.  CT A/P shows mild dilation of the proximal small bowel without abrupt transition point, which may reflect ileus or early obstruction.  Pt received IV NS 1 L bolus and multiple doses of IV Dilaudid  and IV Zofran . General surgery was consulted for evaluation, recommended NG tube placement and admission to [facility] service. NG tube was placed by EDP and patient was transferred to [facility].  Review of Systems: Please see HPI for pertinent positives and negatives. A complete 10 system review of systems are otherwise negative.  Past Medical History:  Diagnosis Date   Asthma    Past Surgical History:  Procedure Laterality Date   SHOULDER ARTHROSCOPY WITH ROTATOR CUFF REPAIR AND SUBACROMIAL DECOMPRESSION Left 05/10/2022   Procedure: LEFT SHOULDER ARTHROSCOPY, ROTATOR CUFF REPAIR, EXTENSIVE DEBRIDEMENT, BICEPS TENODESIS AND  SUBACROMIAL DECOMPRESSION;  Surgeon: Wes Hamman, MD;  Location: [facility];  Service: Orthopedics;  Laterality: Left;   Social History:  reports that he has been smoking cigarettes. He has never used smokeless tobacco. He reports current drug use. Drug: Marijuana. He reports that he does not drink alcohol.  Allergies  Allergen Reactions   Penicillins Other (See Comments)    Pt doesn't know the reaction, had it as a child.    History reviewed. No pertinent family history.   Prior to Admission medications   Medication Sig Start Date End Date Taking? Authorizing Provider  acetaminophen -codeine  (TYLENOL  #3) 300-30 MG tablet Take 1 tablet by mouth 2 (two) times daily as needed for moderate pain. 08/08/22   Sandie Cross, PA-C  albuterol  (PROVENTIL  HFA;VENTOLIN  HFA) 108 (90 Base) MCG/ACT inhaler Inhale 1-2 puffs into the lungs every 4 (four) hours as needed for wheezing or shortness of breath (or cough). 09/03/17   Street, [place], PA-C  gabapentin  (NEURONTIN ) 300 MG capsule Take 1 capsule (300 mg total) by mouth 3 (three) times daily. 10/05/21   Margaree Shark, MD  HYDROcodone -acetaminophen  (NORCO) 5-325 MG tablet Take 1 tablet by mouth daily as needed for moderate pain. 07/18/22   Wilhelmenia Harada, MD  HYDROmorphone  (DILAUDID ) 2 MG tablet Take 1 tablet (2 mg total) by mouth every 4 (four) hours as needed for severe pain. 05/11/22   Wes Hamman, MD  ibuprofen  (ADVIL ) 600 MG tablet Take 1 tablet (600 mg total) by mouth every 6 (six) hours as needed. 09/11/19   Debbra Fairy, PA-C  ketorolac  (TORADOL ) 10 MG tablet Take  1 tablet (10 mg total) by mouth 2 (two) times daily as needed. 05/11/22   Wes Hamman, MD  meloxicam  (MOBIC ) 15 MG tablet Take 1 tablet (15 mg total) by mouth daily. 05/14/22   Wilhelmenia Harada, MD  methocarbamol  (ROBAXIN ) 750 MG tablet Take 1 tablet (750 mg total) by mouth 2 (two) times daily as needed for muscle spasms. 07/25/22   Sandie Cross, PA-C  Multiple  Vitamin (MULTIVITAMIN WITH MINERALS) TABS tablet Take 1 tablet by mouth daily.    [provider]  ondansetron  (ZOFRAN ) 4 MG tablet Take 1-2 tablets (4-8 mg total) by mouth every 8 (eight) hours as needed for nausea or vomiting. 05/10/22   Wes Hamman, MD  oxyCODONE  (ROXICODONE ) 5 MG immediate release tablet Take 1 tablet (5 mg total) by mouth every 6 (six) hours as needed for severe pain or breakthrough pain. 05/18/22   Sandie Cross, PA-C  oxyCODONE -acetaminophen  (PERCOCET) 5-325 MG tablet Take 1-2 tablets by mouth 2 (two) times daily as needed for severe pain. 05/10/22   Wes Hamman, MD  oxyCODONE -acetaminophen  (PERCOCET) 5-325 MG tablet Take 1 tablet by mouth 2 (two) times daily as needed. 05/25/22   Sandie Cross, PA-C  oxyCODONE -acetaminophen  (PERCOCET) 5-325 MG tablet Take 1 tablet by mouth daily as needed for severe pain. 06/09/22   Wes Hamman, MD  predniSONE  (DELTASONE ) 5 MG tablet Take 6 pills for first day, 5 pills second day, 4 pills third day, 3 pills fourth day, 2 pills the fifth day, and 1 pill sixth day. 10/05/21   Margaree Shark, MD  traMADol  (ULTRAM ) 50 MG tablet Take 1-2 tablets (50-100 mg total) by mouth 2 (two) times daily. 06/27/22   Sandie Cross, PA-C    Physical Exam: BP (!) 158/90 (BP Location: Left Arm)   Pulse 70   Temp 98.1 F (36.7 C) (Oral)   Resp 18   SpO2 99%  General: Sick appearing middle-age man laying in bed. No acute distress. HEENT: [place]/AT. Anicteric sclera CV: RRR. No murmurs, rubs, or gallops. No LE edema Pulmonary: Lungs CTAB. Normal effort. Mild expiratory wheezes of the upper lung fields. Abdominal: Soft, nondistended. Mild generalized tenderness to palpation, worse in the epigastric and lower abdomen. Tinkling bowel sounds.  Extremities: Palpable radial and DP pulses. Normal ROM. Skin: Warm and dry. No obvious rash or lesions. Neuro: A&Ox3. Moves all extremities. Normal sensation to light touch. No focal deficit. Psych: Normal  mood and affect          Labs on Admission:  Basic Metabolic Panel: Recent Labs  Lab 10/07/23 1639  NA 140  K 4.3  CL 101  CO2 24  GLUCOSE 139*  BUN 17  CREATININE 0.96  CALCIUM 9.8   Liver Function Tests: Recent Labs  Lab 10/07/23 1639  AST 24  ALT 23  ALKPHOS 75  BILITOT 0.4  PROT 7.4  ALBUMIN 4.4   Recent Labs  Lab 10/07/23 1639  LIPASE 16   No results for input(s): AMMONIA in the last 168 hours. CBC: Recent Labs  Lab 10/07/23 1639  WBC 15.0*  HGB 16.4  HCT 47.7  MCV 88.3  PLT 337   Cardiac Enzymes: No results for input(s): CKTOTAL, CKMB, CKMBINDEX, TROPONINI in the last 168 hours. BNP (last 3 results) No results for input(s): BNP in the last 8760 hours.  ProBNP (last 3 results) No results for input(s): PROBNP in the last 8760 hours.  CBG: No results for input(s): GLUCAP in the  last 168 hours.  Radiological Exams on Admission: DG Abd Portable 1 View Result Date: 10/07/2023 CLINICAL DATA:  Nasogastric tube placement. EXAM: PORTABLE ABDOMEN - 1 VIEW COMPARISON:  None Available. FINDINGS: An enteric tube is seen with its distal tip overlying the body of the stomach. The distal side hole sits just above the expected level of the gastroesophageal junction. The bowel gas pattern is normal. Radiopaque contrast is seen within the bilateral renal collecting systems. No radio-opaque calculi or other significant radiographic abnormality are seen. IMPRESSION: Enteric tube positioning, as described above. Advancement of the enteric tube by approximately 5 cm is recommended to decrease the risk of aspiration. Electronically Signed   By: Virgle Grime M.D.   On: 10/07/2023 23:05   CT ABDOMEN PELVIS W CONTRAST Result Date: 10/07/2023 CLINICAL DATA:  Abdominal pain, vomiting EXAM: CT ABDOMEN AND PELVIS WITH CONTRAST TECHNIQUE: Multidetector CT imaging of the abdomen and pelvis was performed using the standard protocol following bolus administration  of intravenous contrast. RADIATION DOSE REDUCTION: This exam was performed according to the departmental dose-optimization program which includes automated exposure control, adjustment of the mA and/or kV according to patient size and/or use of iterative reconstruction technique. CONTRAST:  OMNIPAQUE IOHEXOL 300 MG/ML  SOLN COMPARISON:  09/11/2019 FINDINGS: Lower chest: No acute pleural or parenchymal lung disease. Hepatobiliary: No focal liver abnormality is seen. No gallstones, gallbladder wall thickening, or biliary dilatation. Pancreas: Unremarkable. No pancreatic ductal dilatation or surrounding inflammatory changes. Spleen: Normal in size without focal abnormality. Adrenals/Urinary Tract: Adrenal glands are unremarkable. Kidneys are normal, without renal calculi, focal lesion, or hydronephrosis. Bladder is unremarkable. Stomach/Bowel: There is mild dilation of the proximal jejunum measuring up to 3.8 cm in diameter. Gradual return to normal caliber of the distal small bowel with no focal transition point identified. Moderate gas and stool within the proximal colon. Normal appendix right lower quadrant. No bowel wall thickening or inflammatory change. Vascular/Lymphatic: No significant vascular findings are present. No enlarged abdominal or pelvic lymph nodes. Reproductive: Prostate is unremarkable. Other: No free fluid or free intraperitoneal gas. Small fat containing umbilical hernia. No bowel herniation. Musculoskeletal: No bowel obstruction or ileus. Reconstructed images demonstrate no additional findings. IMPRESSION: 1. Mild dilation of the proximal small bowel without abrupt transition point, which may reflect ileus or early obstruction. Continued radiographic follow-up recommended. Electronically Signed   By: Bobbye Burrow M.D.   On: 10/07/2023 19:45   Assessment/Plan Wesley Common Greco Gastelum. is a 47 y.o. male with medical history significant for asthma, polysubstance use disorder, chronic pain, and  cervical radiculopathy who presented to med [facility] ED for evaluation of abdominal pain, nausea and vomiting and admitted for small bowel obstruction  # Small bowel obstruction - Patient on chronic opioids presented with 1 day of generalized abdominal pain, and persistent nausea and vomiting - Abdominal imaging shows signs of ileus versus early obstruction - No history of abdominal surgeries, SBO likely secondary to chronic opioid use - Patient continued to have persistent nausea and vomiting in the ED and unable to tolerate p.o. intake - General Surgery consulted for evaluation, recommended conservative management for now with NG tube - NG tube placed in the ED, required some advancement after admission - Pain control with as needed IV Dilaudid  - Antiemetic with as needed IV Zofran  - IV NS 100 cc/h for 1 day  # Mild asthma exacerbation - Reports ongoing wheezing and dry cough over the last few days - Recently started on prednisone  for asthma  exacerbation - Continues to have wheezing on exam but in no respiratory distress - IV Solu-Medrol 40 mg daily while n.p.o. 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 - Apply lidocaine  patch to left shoulder  # Tobacco use disorder - Reports she smokes at least 1 pack of cigarettes per day - Requesting nicotine  patch due to increased anxiety and possible acute during withdrawal - Start nicotine  patch every 24 hours  # Vitamin D deficiency - Continue high-dose vitamin D every 7 days  DVT prophylaxis: Lovenox      Code Status: Full Code  Consults called: General Surgery  Family  Communication: No family at bedside  Severity of Illness: The appropriate patient status for this patient is INPATIENT. Inpatient status is judged to be reasonable and necessary in order to provide the required intensity of service to ensure the patient's safety. The patient's presenting symptoms, physical exam findings, and initial radiographic and laboratory data in the context of their chronic comorbidities is felt to place them at high risk for further clinical deterioration. Furthermore, it is not anticipated that the patient will be medically stable for discharge from the hospital within 2 midnights of admission.   * I certify that at the point of admission it is my clinical judgment that the patient will require inpatient hospital care spanning beyond 2 midnights from the point of admission due to high intensity of service, high risk for further deterioration and high frequency of surveillance required.*  Level of care: Med-Surg   This record has been created using Conservation officer, historic buildings. Errors have been sought and corrected, but may not always be located. Such creation errors do not reflect on the standard of care.   Wesley Grip, MD 10/08/2023, 12:04 AM [facility] Pager: [phone] Isaiah 41:10   If 7PM-7AM, please contact night-coverage [email or web address] Password [email or web address]

## 2023-10-08 NOTE — Progress Notes (Signed)
 PROGRESS NOTE    Wesley El.  ZOX:[id number] DOB: [birth year] DOA: 10/07/2023 PCP: Patient, No Pcp Per   Brief Narrative:  48 y.o. male with medical history significant for asthma, polysubstance use disorder, chronic pain, and cervical radiculopathy presented with abdominal pain with nausea and vomiting.  On presentation, WBC was 15 with normal kidney function, lipase and LFTs.  CT of abdomen and pelvis showed findings suggestive of ileus or early obstruction.  He was started on IV fluids, analgesics and antiemetics as needed.  General surgery recommended NGT placement.  Assessment & Plan:   Probable ileus versus small bowel obstruction - Imaging as above. -NG tube placed in the ED as per general surgery recommendations.  Follow general surgery recommendations - Continue n.p.o., IV fluids, analgesics and antiemetics as needed.  Mild asthma exacerbation - Continue Solu-Medrol.  Continue nebs as needed.  Currently on room air  Chronic shoulder pain with chronic opiate dependence Cervical radiculopathy -Continue current IV pain meds.  Outpatient follow-up with PCP and/or pain management  Tobacco use disorder - Continue nicotine  patch  Vitamin D deficiency - Resume vitamin D supplementation every 7 days on discharge  Leukocytosis - Resolved    DVT prophylaxis: Lovenox  Code Status: Full Family Communication: None at bedside Disposition Plan: Status is: Inpatient Remains inpatient appropriate because: Of severity of illness    Consultants: General Surgery  Procedures: None  Antimicrobials: None   Subjective: Patient seen and examined at bedside.  Has intermittent abdominal pain with some nausea.  No fever, chest pain or shortness of breath reported.  Objective: Vitals:   10/08/23 0527 10/08/23 0641 10/08/23 0902 10/08/23 1029  BP: (!) 144/82   (!) 141/94  Pulse: 63   71  Resp:    16  Temp: 98.1 F (36.7 C)   98.9 F (37.2 C)  TempSrc: Oral   Oral  SpO2:  94% 93%  98%  Weight:   79.5 kg   Height:   5' 7 (1.702 m)     Intake/Output Summary (Last 24 hours) at 10/08/2023 1039 Last data filed at 10/08/2023 1000 Gross per 24 hour  Intake 1674.79 ml  Output 1200 ml  Net 474.79 ml   Filed Weights   10/08/23 0902  Weight: 79.5 kg    Examination:  General exam: Appears calm and comfortable.  On room air  Respiratory system: Bilateral decreased breath sounds at bases, scattered mild wheezing Cardiovascular system: S1 & S2 heard, Rate controlled Gastrointestinal system: Abdomen is distended, soft and mildly tender.  Bowel sounds sluggish Extremities: No cyanosis, clubbing, edema  Central nervous system: Alert and oriented. No focal neurological deficits. Moving extremities Skin: No rashes, lesions or ulcers Psychiatry: Flat affect.  Not agitated.   Data Reviewed: I have personally reviewed following labs and imaging studies  CBC: Recent Labs  Lab 10/07/23 1639 10/08/23 0427  WBC 15.0* 9.7  HGB 16.4 15.8  HCT 47.7 48.3  MCV 88.3 91.8  PLT 337 314   Basic Metabolic Panel: Recent Labs  Lab 10/07/23 1639 10/08/23 0427  NA 140 137  K 4.3 4.0  CL 101 102  CO2 24 26  GLUCOSE 139* 160*  BUN 17 15  CREATININE 0.96 0.79  CALCIUM 9.8 9.1  MG  --  2.0  PHOS  --  1.7*   GFR: Estimated Creatinine Clearance: 116.7 mL/min (by C-G formula based on SCr of 0.79 mg/dL). Liver Function Tests: Recent Labs  Lab 10/07/23 1639  AST 24  ALT 23  ALKPHOS  75  BILITOT 0.4  PROT 7.4  ALBUMIN 4.4   Recent Labs  Lab 10/07/23 1639  LIPASE 16   No results for input(s): AMMONIA in the last 168 hours. Coagulation Profile: No results for input(s): INR, PROTIME in the last 168 hours. Cardiac Enzymes: No results for input(s): CKTOTAL, CKMB, CKMBINDEX, TROPONINI in the last 168 hours. BNP (last 3 results) No results for input(s): PROBNP in the last 8760 hours. HbA1C: No results for input(s): HGBA1C in the last 72  hours. CBG: No results for input(s): GLUCAP in the last 168 hours. Lipid Profile: No results for input(s): CHOL, HDL, LDLCALC, TRIG, CHOLHDL, LDLDIRECT in the last 72 hours. Thyroid Function Tests: No results for input(s): TSH, T4TOTAL, FREET4, T3FREE, THYROIDAB in the last 72 hours. Anemia Panel: No results for input(s): VITAMINB12, FOLATE, FERRITIN, TIBC, IRON, RETICCTPCT in the last 72 hours. Sepsis Labs: No results for input(s): PROCALCITON, LATICACIDVEN in the last 168 hours.  No results found for this or any previous visit (from the past 240 hours).       Radiology Studies: DG Abd Portable 1V Result Date: 10/08/2023 EXAM: 1 VIEW XRAY OF THE ABDOMEN SUPINE 10/08/2023 12:41:58 AM COMPARISON: 10/07/2023 CLINICAL HISTORY: [id number] Nasogastric tube present [id number]. NG tube placement FINDINGS: BOWEL: Nonobstructive bowel gas pattern. PERITONEUM AND SOFT TISSUES: No abnormal calcifications. BONES: No acute osseous abnormality. LINES AND TUBES: Enteric tube terminates in the gastric antrum. IMPRESSION: 1. Enteric tube terminates in the gastric antrum. Electronically signed by: Zadie Herter MD 10/08/2023 12:50 AM EDT RP Workstation: [id number]   DG Abd Portable 1 View Result Date: 10/07/2023 CLINICAL DATA:  Nasogastric tube placement. EXAM: PORTABLE ABDOMEN - 1 VIEW COMPARISON:  None Available. FINDINGS: An enteric tube is seen with its distal tip overlying the body of the stomach. The distal side hole sits just above the expected level of the gastroesophageal junction. The bowel gas pattern is normal. Radiopaque contrast is seen within the bilateral renal collecting systems. No radio-opaque calculi or other significant radiographic abnormality are seen. IMPRESSION: Enteric tube positioning, as described above. Advancement of the enteric tube by approximately 5 cm is recommended to decrease the risk of aspiration. Electronically Signed   By: Virgle Grime M.D.   On: 10/07/2023 23:05   CT ABDOMEN PELVIS W CONTRAST Result Date: 10/07/2023 CLINICAL DATA:  Abdominal pain, vomiting EXAM: CT ABDOMEN AND PELVIS WITH CONTRAST TECHNIQUE: Multidetector CT imaging of the abdomen and pelvis was performed using the standard protocol following bolus administration of intravenous contrast. RADIATION DOSE REDUCTION: This exam was performed according to the departmental dose-optimization program which includes automated exposure control, adjustment of the mA and/or kV according to patient size and/or use of iterative reconstruction technique. CONTRAST:  OMNIPAQUE IOHEXOL 300 MG/ML  SOLN COMPARISON:  09/11/2019 FINDINGS: Lower chest: No acute pleural or parenchymal lung disease. Hepatobiliary: No focal liver abnormality is seen. No gallstones, gallbladder wall thickening, or biliary dilatation. Pancreas: Unremarkable. No pancreatic ductal dilatation or surrounding inflammatory changes. Spleen: Normal in size without focal abnormality. Adrenals/Urinary Tract: Adrenal glands are unremarkable. Kidneys are normal, without renal calculi, focal lesion, or hydronephrosis. Bladder is unremarkable. Stomach/Bowel: There is mild dilation of the proximal jejunum measuring up to 3.8 cm in diameter. Gradual return to normal caliber of the distal small bowel with no focal transition point identified. Moderate gas and stool within the proximal colon. Normal appendix right lower quadrant. No bowel wall thickening or inflammatory change. Vascular/Lymphatic: No significant vascular findings are present. No enlarged abdominal or  pelvic lymph nodes. Reproductive: Prostate is unremarkable. Other: No free fluid or free intraperitoneal gas. Small fat containing umbilical hernia. No bowel herniation. Musculoskeletal: No bowel obstruction or ileus. Reconstructed images demonstrate no additional findings. IMPRESSION: 1. Mild dilation of the proximal small bowel without abrupt transition point,  which may reflect ileus or early obstruction. Continued radiographic follow-up recommended. Electronically Signed   By: Bobbye Burrow M.D.   On: 10/07/2023 19:45        Scheduled Meds:  enoxaparin  (LOVENOX ) injection  40 mg Subcutaneous Q24H   methylPREDNISolone (SOLU-MEDROL) injection  40 mg Intravenous Daily   nicotine   21 mg Transdermal Daily   polyethylene glycol  17 g Oral BID   Continuous Infusions:  sodium chloride  100 mL/hr at 10/08/23 1610          Audria Leather, MD [facility] 10/08/2023, 10:39 AM

## 2023-10-09 DIAGNOSIS — K56609 Unspecified intestinal obstruction, unspecified as to partial versus complete obstruction: Secondary | ICD-10-CM | POA: Diagnosis not present

## 2023-10-09 LAB — CBC WITH DIFFERENTIAL/PLATELET
Abs Immature Granulocytes: 0.04 10*3/uL (ref 0.00–0.07)
Basophils Absolute: 0.1 10*3/uL (ref 0.0–0.1)
Basophils Relative: 1 %
Eosinophils Absolute: 0.2 10*3/uL (ref 0.0–0.5)
Eosinophils Relative: 3 %
HCT: 44.7 % (ref 39.0–52.0)
Hemoglobin: 14.3 g/dL (ref 13.0–17.0)
Immature Granulocytes: 0 %
Lymphocytes Relative: 36 %
Lymphs Abs: 3.3 10*3/uL (ref 0.7–4.0)
MCH: 30 pg (ref 26.0–34.0)
MCHC: 32 g/dL (ref 30.0–36.0)
MCV: 93.7 fL (ref 80.0–100.0)
Monocytes Absolute: 0.8 10*3/uL (ref 0.1–1.0)
Monocytes Relative: 9 %
Neutro Abs: 4.6 10*3/uL (ref 1.7–7.7)
Neutrophils Relative %: 51 %
Platelets: 303 10*3/uL (ref 150–400)
RBC: 4.77 MIL/uL (ref 4.22–5.81)
RDW: 13.1 % (ref 11.5–15.5)
WBC: 9 10*3/uL (ref 4.0–10.5)
nRBC: 0 % (ref 0.0–0.2)

## 2023-10-09 LAB — BASIC METABOLIC PANEL WITH GFR
Anion gap: 8 (ref 5–15)
BUN: 18 mg/dL (ref 6–20)
CO2: 26 mmol/L (ref 22–32)
Calcium: 8.8 mg/dL — ABNORMAL LOW (ref 8.9–10.3)
Chloride: 102 mmol/L (ref 98–111)
Creatinine, Ser: 1.06 mg/dL (ref 0.61–1.24)
GFR, Estimated: >60 mL/min (ref >60)
Glucose, Bld: 117 mg/dL — ABNORMAL HIGH (ref 70–99)
Potassium: 3.8 mmol/L (ref 3.5–5.1)
Sodium: 136 mmol/L (ref 135–145)

## 2023-10-09 LAB — MAGNESIUM: Magnesium: 2.3 mg/dL (ref 1.7–2.4)

## 2023-10-09 LAB — HIV ANTIBODY (ROUTINE TESTING W REFLEX): HIV Screen 4th Generation wRfx: NONREACTIVE

## 2023-10-09 MED ORDER — ONDANSETRON HCL 4 MG PO TABS
4.0000 mg | ORAL_TABLET | Freq: Four times a day (QID) | ORAL | 0 refills | Status: AC | PRN
Start: 2023-10-09 — End: ?

## 2023-10-09 MED ORDER — SENNOSIDES-DOCUSATE SODIUM 8.6-50 MG PO TABS: 1.0000 | ORAL_TABLET | Freq: Every evening | ORAL | 0 refills | Status: AC | PRN

## 2023-10-09 MED ORDER — POLYETHYLENE GLYCOL 3350 17 G PO PACK: 17.0000 g | PACK | Freq: Two times a day (BID) | ORAL | 0 refills | Status: AC

## 2023-10-09 NOTE — Plan of Care (Signed)
 ?  Problem: Clinical Measurements: ?Goal: Ability to maintain clinical measurements within normal limits will improve ?Outcome: Progressing ?Goal: Will remain free from infection ?Outcome: Progressing ?Goal: Diagnostic test results will improve ?Outcome: Progressing ?  ?

## 2023-10-09 NOTE — Progress Notes (Signed)
 Patient discharged home, IV removed, discharge paperwork provided and explained to patient and patient's wife, patient and patient's wife verbalized understanding.

## 2023-10-09 NOTE — Discharge Summary (Signed)
 Physician Discharge Summary  Wesley Larson:[id number] DOB: April 20, 1976 DOA: 10/07/2023  PCP: Patient, No Pcp Per  Admit date: 10/07/2023 Discharge date: 10/09/2023  Admitted From: Home Disposition: Home  Recommendations for Outpatient Follow-up:  Follow up with PCP in 1 week  Outpatient follow-up with general surgery Follow up in ED if symptoms worsen or new appear   Home Health: No Equipment/Devices: None  Discharge Condition: Stable CODE STATUS: Full Diet recommendation: Regular  Brief/Interim Summary: 48 y.o. male with medical history significant for asthma, polysubstance use disorder, chronic pain, and cervical radiculopathy presented with abdominal pain with nausea and vomiting.  On presentation, WBC was 15 with normal kidney function, lipase and LFTs.  CT of abdomen and pelvis showed findings suggestive of ileus or early obstruction.  He was started on IV fluids, analgesics and antiemetics as needed.  General surgery recommended NGT placement.  General hospitalization, his condition has improved.  NG tube was removed as per general surgery recommendations and diet has been advanced.  He is currently tolerating soft diet and having bowel movements.  General surgery has cleared him for discharge.  Patient feels much better and wants to go home today.  Discharge patient home today.  Discharge Diagnoses:   Probable ileus versus small bowel obstruction versus constipation after ingestion of large volume of fiber - Imaging as above. -He was started on IV fluids, analgesics and antiemetics as needed.  General surgery recommended NGT placement.  General hospitalization, his condition has improved.  NG tube was removed as per general surgery recommendations and diet has been advanced.  He has been put on scheduled MiraLAX twice daily by general surgery which will be continued.  He is currently tolerating soft diet and having bowel movements.  General surgery has cleared him for  discharge.  Patient feels much better and wants to go home today.  Discharge patient home today.   Mild asthma exacerbation - Treated with Solu-Medrol.  Resume home prednisone  as prescribed prior to presentation.  Continue home inhaled and nebulized regimen.  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  Outpatient follow-up with PCP and/or pain management   Tobacco use disorder - Counseled regarding tobacco cessation by prior hospitalist.  Outpatient follow-up with PCP   Vitamin D deficiency - Resume vitamin D supplementation every 7 days on discharge   Leukocytosis - Resolved  Hypophosphatemia - No labs today    Discharge Instructions   Allergies as of 10/09/2023       Reactions   Ondansetron  Other (See Comments)   Stomach Issues    Penicillins Other (See Comments)   Pt doesn't know the reaction, had it as a child.        Medication List     STOP taking these medications    ipratropium-albuterol  0.5-2.5 (3) MG/3ML Soln Commonly known as: DUONEB       TAKE these medications    albuterol  108 (90 Base) MCG/ACT inhaler Commonly known as: VENTOLIN  HFA Inhale 1-2 puffs into the lungs every 4 (four) hours as needed for wheezing or shortness of breath (or cough). What changed: when to take this   albuterol  (2.5 MG/3ML) 0.083% nebulizer solution Commonly known as: PROVENTIL  Take 2.5 mg by nebulization every 6 (six) hours as needed for wheezing or shortness of breath. What changed: Another medication with the same name was changed. Make sure you understand how and when to take each.   hydrOXYzine  25 MG tablet Commonly known as: ATARAX Take 25 mg by mouth See admin instructions. Take 25mg  (1 tablet) by mouth two to three times daily.   ondansetron  4 MG tablet Commonly known as: ZOFRAN  Take 1 tablet (4 mg total) by mouth every 6 (six) hours as needed for nausea.    oxyCODONE -acetaminophen  7.5-325 MG tablet Commonly known as: PERCOCET Take 1 tablet by mouth See admin instructions. Take 1 tablet by mouth four to six times a day   polyethylene glycol 17 g packet Commonly known as: MIRALAX / GLYCOLAX Take 17 g by mouth 2 (two) times daily.   predniSONE  20 MG tablet Commonly known as: DELTASONE  Take 20 mg by mouth at bedtime. Until finished for 2 weeks.   rosuvastatin 5 MG tablet Commonly known as: CRESTOR Take 5 mg by mouth at bedtime.   senna-docusate 8.6-50 MG tablet Commonly known as: Senokot-S Place 1 tablet into feeding tube at bedtime as needed for mild constipation.   Vitamin D (Ergocalciferol) 1.25 MG (50000 UNIT) Caps capsule Commonly known as: DRISDOL Take 50,000 Units by mouth once a week.        Follow-up Information     PCP. Schedule an appointment as soon as possible for a visit in 1 week(s).                 Allergies  Allergen Reactions   Ondansetron  Other (See Comments)    Stomach Issues    Penicillins Other (See Comments)    Pt doesn't know the reaction, had it as a child.    Consultations: General surgery   Procedures/Studies: DG Abd Portable 1V Result Date: 10/08/2023 EXAM: 1 VIEW XRAY OF THE ABDOMEN SUPINE 10/08/2023 12:41:58 AM COMPARISON: 10/07/2023 CLINICAL HISTORY: [id number] Nasogastric tube present [id number]. NG tube placement FINDINGS: BOWEL: Nonobstructive bowel gas pattern. PERITONEUM AND SOFT TISSUES: No abnormal calcifications. BONES: No acute osseous abnormality. LINES AND TUBES: Enteric tube terminates in the gastric antrum. IMPRESSION: 1. Enteric tube terminates in the gastric antrum. Electronically signed by: Zadie Herter MD 10/08/2023 12:50 AM EDT RP Workstation: [id number]   DG Abd Portable 1 View Result Date: 10/07/2023 CLINICAL DATA:  Nasogastric tube placement. EXAM: PORTABLE ABDOMEN - 1 VIEW COMPARISON:  None Available. FINDINGS: An enteric tube is seen with its distal tip overlying  the body of the stomach. The distal side hole sits just above the expected level of the gastroesophageal junction. The bowel gas pattern is normal. Radiopaque contrast is seen within the bilateral renal collecting systems. No radio-opaque calculi or other significant radiographic abnormality are seen. IMPRESSION: Enteric tube positioning, as described above. Advancement of the enteric tube by approximately 5 cm is recommended to decrease the risk of aspiration. Electronically Signed   By: Virgle Grime M.D.   On: 10/07/2023 23:05   CT ABDOMEN PELVIS W CONTRAST Result Date: 10/07/2023 CLINICAL DATA:  Abdominal pain, vomiting EXAM: CT ABDOMEN AND PELVIS WITH CONTRAST TECHNIQUE: Multidetector CT imaging of the abdomen and pelvis was performed using the standard protocol following bolus administration of intravenous contrast. RADIATION DOSE REDUCTION: This exam was performed according to the departmental dose-optimization program which includes automated exposure control, adjustment of the mA and/or kV according to patient size and/or use of iterative reconstruction technique. CONTRAST:  OMNIPAQUE IOHEXOL 300 MG/ML  SOLN COMPARISON:  09/11/2019 FINDINGS: Lower chest: No acute pleural or parenchymal lung disease. Hepatobiliary: No focal liver abnormality is seen. No gallstones, gallbladder wall thickening, or biliary dilatation. Pancreas: Unremarkable. No pancreatic ductal dilatation or surrounding  inflammatory changes. Spleen: Normal in size without focal abnormality. Adrenals/Urinary Tract: Adrenal glands are unremarkable. Kidneys are normal, without renal calculi, focal lesion, or hydronephrosis. Bladder is unremarkable. Stomach/Bowel: There is mild dilation of the proximal jejunum measuring up to 3.8 cm in diameter. Gradual return to normal caliber of the distal small bowel with no focal transition point identified. Moderate gas and stool within the proximal colon. Normal appendix right lower quadrant.  No bowel wall thickening or inflammatory change. Vascular/Lymphatic: No significant vascular findings are present. No enlarged abdominal or pelvic lymph nodes. Reproductive: Prostate is unremarkable. Other: No free fluid or free intraperitoneal gas. Small fat containing umbilical hernia. No bowel herniation. Musculoskeletal: No bowel obstruction or ileus. Reconstructed images demonstrate no additional findings. IMPRESSION: 1. Mild dilation of the proximal small bowel without abrupt transition point, which may reflect ileus or early obstruction. Continued radiographic follow-up recommended. Electronically Signed   By: Bobbye Burrow M.D.   On: 10/07/2023 19:45      Subjective: Patient seen and examined at bedside.  Feels much better and having bowel movements and tolerating diet.  Wants to go home today.  Discharge Exam: Vitals:   10/08/23 2043 10/09/23 0517  BP: 133/83 120/72  Pulse: (!) 54 (!) 53  Resp: 17 18  Temp: 98 F (36.7 C) (!) 97.2 F (36.2 C)  SpO2: 100% 97%    General: Pt is alert, awake, not in acute distress.  On room air Cardiovascular: Mild intermittent bradycardia present, S1/S2 + Respiratory: bilateral decreased breath sounds at bases with some wheezing Abdominal: Soft, NT, ND, bowel sounds + Extremities: no edema, no cyanosis    The results of significant diagnostics from this hospitalization (including imaging, microbiology, ancillary and laboratory) are listed below for reference.     Microbiology: No results found for this or any previous visit (from the past 240 hours).   Labs: BNP (last 3 results) No results for input(s): BNP in the last 8760 hours. Basic Metabolic Panel: Recent Labs  Lab 10/07/23 1639 10/08/23 0427  NA 140 137  K 4.3 4.0  CL 101 102  CO2 24 26  GLUCOSE 139* 160*  BUN 17 15  CREATININE 0.96 0.79  CALCIUM 9.8 9.1  MG  --  2.0  PHOS  --  1.7*   Liver Function Tests: Recent Labs  Lab 10/07/23 1639  AST 24  ALT 23   ALKPHOS 75  BILITOT 0.4  PROT 7.4  ALBUMIN 4.4   Recent Labs  Lab 10/07/23 1639  LIPASE 16   No results for input(s): AMMONIA in the last 168 hours. CBC: Recent Labs  Lab 10/07/23 1639 10/08/23 0427  WBC 15.0* 9.7  HGB 16.4 15.8  HCT 47.7 48.3  MCV 88.3 91.8  PLT 337 314   Cardiac Enzymes: No results for input(s): CKTOTAL, CKMB, CKMBINDEX, TROPONINI in the last 168 hours. BNP: Invalid input(s): POCBNP CBG: No results for input(s): GLUCAP in the last 168 hours. D-Dimer No results for input(s): DDIMER in the last 72 hours. Hgb A1c No results for input(s): HGBA1C in the last 72 hours. Lipid Profile No results for input(s): CHOL, HDL, LDLCALC, TRIG, CHOLHDL, LDLDIRECT in the last 72 hours. Thyroid function studies No results for input(s): TSH, T4TOTAL, T3FREE, THYROIDAB in the last 72 hours.  Invalid input(s): FREET3 Anemia work up No results for input(s): VITAMINB12, FOLATE, FERRITIN, TIBC, IRON, RETICCTPCT in the last 72 hours. Urinalysis    Component Value Date/Time   COLORURINE YELLOW 10/07/2023 1634   APPEARANCEUR CLEAR  10/07/2023 1634   LABSPEC >=1.030 10/07/2023 1634   PHURINE 6.5 10/07/2023 1634   GLUCOSEU NEGATIVE 10/07/2023 1634   HGBUR SMALL (A) 10/07/2023 1634   BILIRUBINUR NEGATIVE 10/07/2023 1634   KETONESUR NEGATIVE 10/07/2023 1634   PROTEINUR 100 (A) 10/07/2023 1634   NITRITE NEGATIVE 10/07/2023 1634   LEUKOCYTESUR NEGATIVE 10/07/2023 1634   Sepsis Labs Recent Labs  Lab 10/07/23 1639 10/08/23 0427  WBC 15.0* 9.7   Microbiology No results found for this or any previous visit (from the past 240 hours).   Time coordinating discharge: 35 minutes  SIGNED:   Audria Leather, MD  [facility] 10/09/2023, 7:40 AM

## 2023-12-03 ENCOUNTER — Emergency Department (HOSPITAL_BASED_OUTPATIENT_CLINIC_OR_DEPARTMENT_OTHER)

## 2023-12-03 ENCOUNTER — Encounter (HOSPITAL_BASED_OUTPATIENT_CLINIC_OR_DEPARTMENT_OTHER): Payer: Self-pay | Admitting: Emergency Medicine

## 2023-12-03 ENCOUNTER — Emergency Department (HOSPITAL_BASED_OUTPATIENT_CLINIC_OR_DEPARTMENT_OTHER)
Admission: EM | Admit: 2023-12-03 | Discharge: 2023-12-03 | Disposition: A | Discharge status: Home / Self Care | Attending: Emergency Medicine | Admitting: Emergency Medicine

## 2023-12-03 ENCOUNTER — Other Ambulatory Visit: Payer: Self-pay

## 2023-12-03 DIAGNOSIS — S0181XA Laceration without foreign body of other part of head, initial encounter: Secondary | ICD-10-CM | POA: Insufficient documentation

## 2023-12-03 DIAGNOSIS — S0512XA Contusion of eyeball and orbital tissues, left eye, initial encounter: Secondary | ICD-10-CM | POA: Diagnosis not present

## 2023-12-03 DIAGNOSIS — W228XXA Striking against or struck by other objects, initial encounter: Secondary | ICD-10-CM | POA: Diagnosis not present

## 2023-12-03 DIAGNOSIS — S0993XA Unspecified injury of face, initial encounter: Secondary | ICD-10-CM | POA: Diagnosis present

## 2023-12-03 DIAGNOSIS — S0083XA Contusion of other part of head, initial encounter: Secondary | ICD-10-CM

## 2023-12-03 DIAGNOSIS — Z23 Encounter for immunization: Secondary | ICD-10-CM | POA: Insufficient documentation

## 2023-12-03 DIAGNOSIS — H05232 Hemorrhage of left orbit: Secondary | ICD-10-CM

## 2023-12-03 DIAGNOSIS — Y990 Civilian activity done for income or pay: Secondary | ICD-10-CM | POA: Diagnosis not present

## 2023-12-03 MED ORDER — KETOROLAC TROMETHAMINE 10 MG PO TABS: 10.0000 mg | ORAL_TABLET | Freq: Four times a day (QID) | ORAL | 0 refills | Status: AC | PRN

## 2023-12-03 MED ORDER — TETANUS-DIPHTH-ACELL PERTUSSIS 5-2.5-18.5 LF-MCG/0.5 IM SUSY
0.5000 mL | PREFILLED_SYRINGE | Freq: Once | INTRAMUSCULAR | Status: AC
  Administered 2023-12-03 (×2): 0.5 mL via INTRAMUSCULAR
  Filled 2023-12-03: qty 0.5

## 2023-12-03 MED ORDER — LIDOCAINE-EPINEPHRINE (PF) 2 %-1:200000 IJ SOLN
20.0000 mL | Freq: Once | INTRAMUSCULAR | Status: AC
  Administered 2023-12-03 (×2): 20 mL
  Filled 2023-12-03: qty 20

## 2023-12-03 MED ORDER — OXYCODONE-ACETAMINOPHEN 5-325 MG PO TABS
1.0000 | ORAL_TABLET | Freq: Once | ORAL | Status: AC
  Administered 2023-12-03 (×2): 1 via ORAL
  Filled 2023-12-03: qty 1

## 2023-12-03 NOTE — ED Triage Notes (Signed)
 Pt was drilling with impact drill.  Pt states he has headache, felt dazed when drill hit him to his left cheek, but no LOC.  Pt has laceration to left cheek near his eye.  Bleeding controlled.  Unknown tetanus booster.

## 2023-12-03 NOTE — ED Notes (Signed)
 Cleansed patients left eye wound with wound cleanser. Suture cart at bedside for EDP

## 2023-12-03 NOTE — ED Notes (Signed)
 Patient transported to CT

## 2023-12-03 NOTE — ED Provider Notes (Signed)
 [facility] Provider Note   CSN: [id number] Arrival date & time: 12/03/23  9060     Patient presents with: Facial Injury   Wesley Larson. is a 48 y.o. male.    Facial Injury    48 year old male presenting to the emergency department after being struck in the face by an impact drill while at work earlier today.  The patient endorses loss of consciousness.  He endorses severe headache.  The drill struck him in the left cheek.  His tetanus was not up-to-date and has been updated.  He sustained a small laceration lateral to the lateral canthus to the left eye.  Bleeding is controlled.  He denies any other injuries or complaints.  He arrives GCS 15, ABC intact.  Prior to Admission medications   Medication Sig Start Date End Date Taking? Authorizing Provider  ketorolac  (TORADOL ) 10 MG tablet Take 1 tablet (10 mg total) by mouth every 6 (six) hours as needed. 12/03/23  Yes Jerrol Agent, MD  albuterol  (PROVENTIL  HFA;VENTOLIN  HFA) 108 (90 Base) MCG/ACT inhaler Inhale 1-2 puffs into the lungs every 4 (four) hours as needed for wheezing or shortness of breath (or cough). Patient taking differently: Inhale 1-2 puffs into the lungs daily. 09/03/17   Street, [place], PA-C  albuterol  (PROVENTIL ) (2.5 MG/3ML) 0.083% nebulizer solution Take 2.5 mg by nebulization every 6 (six) hours as needed for wheezing or shortness of breath. 05/09/23   [provider]  hydrOXYzine (ATARAX) 25 MG tablet Take 25 mg by mouth See admin instructions. Take 25mg  (1 tablet) by mouth two to three times daily. 09/21/23   [provider]  ondansetron  (ZOFRAN ) 4 MG tablet Take 1 tablet (4 mg total) by mouth every 6 (six) hours as needed for nausea. 10/09/23   Cheryle Page, MD  oxyCODONE -acetaminophen  (PERCOCET) 7.5-325 MG tablet Take 1 tablet by mouth See admin instructions. Take 1 tablet by mouth four to six times a day 09/21/23   [provider]   polyethylene glycol (MIRALAX  / GLYCOLAX ) 17 g packet Take 17 g by mouth 2 (two) times daily. 10/09/23   Cheryle Page, MD  predniSONE  (DELTASONE ) 20 MG tablet Take 20 mg by mouth at bedtime. Until finished for 2 weeks.    [provider]  rosuvastatin  (CRESTOR ) 5 MG tablet Take 5 mg by mouth at bedtime.    [provider]  senna-docusate (SENOKOT-S) 8.6-50 MG tablet Place 1 tablet into feeding tube at bedtime as needed for mild constipation. 10/09/23   Cheryle Page, MD  Vitamin D, Ergocalciferol, (DRISDOL) 1.25 MG (50000 UNIT) CAPS capsule Take 50,000 Units by mouth once a week. 09/21/23   [provider]    Allergies: Ondansetron  and Penicillins    Review of Systems  All other systems reviewed and are negative.   Updated Vital Signs BP (!) 142/98 (BP Location: Left Arm)   Pulse 79   Temp 98 F (36.7 C) (Oral)   Resp 15   Ht 5' 7 (1.702 m)   Wt 78.9 kg   SpO2 98%   BMI 27.25 kg/m   Physical Exam Vitals and nursing note reviewed.  Constitutional:      Appearance: He is well-developed.     Comments: GCS 15, ABC intact  HENT:     Head: Normocephalic.     Comments: Small 0.5 cm linear laceration just lateral to the lateral canthus of the left eye, hemostatic, surrounding periorbital hematoma Eyes:     Conjunctiva/sclera:  Conjunctivae normal.  Neck:     Comments: No midline tenderness to palpation of the cervical spine. ROM intact. Cardiovascular:     Rate and Rhythm: Normal rate and regular rhythm.  Pulmonary:     Effort: Pulmonary effort is normal. No respiratory distress.     Breath sounds: Normal breath sounds.  Chest:     Comments: Chest wall stable and non-tender to AP and lateral compression. Clavicles stable and non-tender to AP compression Abdominal:     Palpations: Abdomen is soft.     Tenderness: There is no abdominal tenderness.     Comments: Pelvis stable to lateral compression.  Musculoskeletal:     Cervical back: Neck supple.      Comments: No midline tenderness to palpation of the thoracic or lumbar spine. Extremities atraumatic with intact ROM.   Skin:    General: Skin is warm and dry.  Neurological:     Mental Status: He is alert.     Comments: CN II-XII grossly intact. Moving all four extremities spontaneously and sensation grossly intact.     (all labs ordered are listed, but only abnormal results are displayed) Labs Reviewed - No data to display  EKG: None  Radiology: CT Maxillofacial Wo Contrast Result Date: 12/03/2023 CLINICAL DATA:  Trauma EXAM: CT MAXILLOFACIAL WITHOUT CONTRAST TECHNIQUE: Multidetector CT imaging of the maxillofacial structures was performed. Multiplanar CT image reconstructions were also generated. RADIATION DOSE REDUCTION: This exam was performed according to the departmental dose-optimization program which includes automated exposure control, adjustment of the mA and/or kV according to patient size and/or use of iterative reconstruction technique. COMPARISON:  None Available. FINDINGS: The orbital walls and orbital rims are intact. The zygomatic arches are intact. The pterygoid plates are intact. There is no fluid seen in the paranasal sinuses. The mandible is intact. No nasal bone fracture identified. Other comments: None IMPRESSION: Normal Electronically Signed   By: Nancyann Burns M.D.   On: 12/03/2023 12:05   CT Head Wo Contrast Result Date: 12/03/2023 CLINICAL DATA:  Trauma EXAM: CT HEAD WITHOUT CONTRAST TECHNIQUE: Contiguous axial images were obtained from the base of the skull through the vertex without intravenous contrast. RADIATION DOSE REDUCTION: This exam was performed according to the departmental dose-optimization program which includes automated exposure control, adjustment of the mA and/or kV according to patient size and/or use of iterative reconstruction technique. COMPARISON:  None Available. FINDINGS: CT HEAD: There is no hemorrhage. No acute ischemic changes. No mass  lesion. The ventricles are normal. Skull/sinuses/orbits: No significant abnormality. IMPRESSION: Normal Electronically Signed   By: Nancyann Burns M.D.   On: 12/03/2023 12:03     Procedures   Medications Ordered in the ED  Tdap (BOOSTRIX ) injection 0.5 mL (0.5 mLs Intramuscular Given 12/03/23 1051)  oxyCODONE -acetaminophen  (PERCOCET/ROXICET) 5-325 MG per tablet 1 tablet (1 tablet Oral Given 12/03/23 1159)  lidocaine -EPINEPHrine  (XYLOCAINE  W/EPI) 2 %-1:200000 (PF) injection 20 mL (20 mLs Infiltration Given by Other 12/03/23 1214)                                    Medical Decision Making Amount and/or Complexity of Data Reviewed Radiology: ordered.  Risk Prescription drug management.    48 year old male presenting to the emergency department after being struck in the face by an impact drill while at work earlier today.  The patient endorses loss of consciousness.  He endorses severe headache.  The drill struck him in  the left cheek.  His tetanus was not up-to-date and has been updated.  He sustained a small laceration lateral to the lateral canthus to the left eye.  Bleeding is controlled.  He denies any other injuries or complaints.  He arrives GCS 15, ABC intact.  On arrival, the patient was vitally stable, sustained a 0.5 cm laceration to the left cheek just lateral to the lateral canthus of the left eye.  Intact extraocular movements, periorbital hematoma noted, tetanus updated on arrival.  CT Head and MaxFace: Negative for acute injury  Lac Repair: Wound was irrigated and cleaned by nursing staff bedside.  Lack repair was performed as per the procedure note above with 2 nylon sutures 500.  Patient provided with wound care instructions.  Patient provided with guidance regarding symptoms of a concussion, Toradol  prescribed for pain control, advised outpatient follow-up in 5 to 7 days for suture removal and wound reassessment.     Final diagnoses:  Facial laceration, initial encounter   Contusion of face, initial encounter  Periorbital hematoma of left eye    ED Discharge Orders          Ordered    ketorolac  (TORADOL ) 10 MG tablet  Every 6 hours PRN        12/03/23 1251               Jerrol Agent, MD 12/03/23 1253

## 2023-12-03 NOTE — Discharge Instructions (Addendum)
 You sustained a small laceration to your left cheek as well as a facial contusion.  Ice to the affected area for the next 24 hours.  Toradol  has been prescribed for pain control.  Follow-up within 5 to 7 days for wound reassessment and suture removal.  If you develop headache, nausea and vomiting, light sensitivity and sound sensitivity, these would be symptoms consistent with a concussion.  Your CT imaging was negative for acute traumatic injury.

## 2024-01-24 ENCOUNTER — Emergency Department (HOSPITAL_BASED_OUTPATIENT_CLINIC_OR_DEPARTMENT_OTHER)

## 2024-01-24 ENCOUNTER — Encounter (HOSPITAL_BASED_OUTPATIENT_CLINIC_OR_DEPARTMENT_OTHER): Payer: Self-pay

## 2024-01-24 ENCOUNTER — Emergency Department (HOSPITAL_BASED_OUTPATIENT_CLINIC_OR_DEPARTMENT_OTHER)
Admission: EM | Admit: 2024-01-24 | Discharge: 2024-01-24 | Disposition: A | Discharge status: Home / Self Care | Attending: Emergency Medicine | Admitting: Emergency Medicine

## 2024-01-24 ENCOUNTER — Other Ambulatory Visit: Payer: Self-pay

## 2024-01-24 DIAGNOSIS — W108XXA Fall (on) (from) other stairs and steps, initial encounter: Secondary | ICD-10-CM | POA: Insufficient documentation

## 2024-01-24 DIAGNOSIS — M25512 Pain in left shoulder: Secondary | ICD-10-CM | POA: Insufficient documentation

## 2024-01-24 DIAGNOSIS — M545 Low back pain, unspecified: Secondary | ICD-10-CM | POA: Diagnosis not present

## 2024-01-24 DIAGNOSIS — M25552 Pain in left hip: Secondary | ICD-10-CM | POA: Insufficient documentation

## 2024-01-24 DIAGNOSIS — W19XXXA Unspecified fall, initial encounter: Secondary | ICD-10-CM

## 2024-01-24 MED ORDER — LIDOCAINE 5 % EX PTCH: 1.0000 | MEDICATED_PATCH | CUTANEOUS | 0 refills | Status: AC

## 2024-01-24 MED ORDER — KETOROLAC TROMETHAMINE 30 MG/ML IJ SOLN
30.0000 mg | Freq: Once | INTRAMUSCULAR | Status: AC
  Administered 2024-01-24: 30 mg via INTRAMUSCULAR
  Filled 2024-01-24: qty 1

## 2024-01-24 MED ORDER — LIDOCAINE 5 % EX PTCH
1.0000 | MEDICATED_PATCH | CUTANEOUS | Status: DC
  Administered 2024-01-24: 1 via TRANSDERMAL
  Filled 2024-01-24: qty 1

## 2024-01-24 MED ORDER — METHOCARBAMOL 500 MG PO TABS: 500.0000 mg | ORAL_TABLET | Freq: Two times a day (BID) | ORAL | 0 refills | Status: AC

## 2024-01-24 NOTE — Discharge Instructions (Addendum)
 Happy belated birthday.  Your xrays show no fractures.   Your examination today is most concerning for a muscular injury 1. Medications: alternate ibuprofen  and tylenol  for pain control, take all usual home medications as they are prescribed 2. Treatment: rest, ice, elevate and use an ACE wrap or other compressive therapy to decrease swelling. Also drink plenty of fluids and do plenty of gentle stretching and move the affected muscle through its normal range of motion to prevent stiffness. 3. Follow Up: If your symptoms do not improve please follow up with orthopedics/sports medicine or your PCP for discussion of your diagnoses and further evaluation after today's visit; if you do not have a primary care doctor use the resource guide provided to find one; Please return to the ER for worsening symptoms or other concerns.

## 2024-01-24 NOTE — ED Notes (Signed)
 Patient transported to X-ray

## 2024-01-24 NOTE — ED Triage Notes (Signed)
 Pt states that he fell down the steps at his house the other day. States that his left shoulder is in pain. Left back pain is also affecting left hip. Pt is noted to have slow ambulation.

## 2024-01-24 NOTE — ED Provider Notes (Signed)
 [facility] AT [facility] Provider Note   CSN: [id number] Arrival date & time: 01/24/24  9143     Patient presents with: Shoulder Injury   Wesley Nied. is a 48 y.o. male.    Shoulder Injury  Patient is a 48 year old male  Patient presents emergency room today after he slid down the steps 1 week ago.  He states that he stepped on a wet step on the outside porch and slipped to the ground fell on his buttocks and slid down several steps did not strike his head or lose consciousness no nausea or vomiting.  He indicates that he has some pain in his left hip and low back as well as his left shoulder.  He has a history of chronic pain takes Percocet for this.     Prior to Admission medications   Medication Sig Start Date End Date Taking? Authorizing Provider  lidocaine  (LIDODERM ) 5 % Place 1 patch onto the skin daily. Remove & Discard patch within 12 hours or as directed by MD 01/24/24  Yes Dejan Angert, Hamp RAMAN, PA  methocarbamol  (ROBAXIN ) 500 MG tablet Take 1 tablet (500 mg total) by mouth 2 (two) times daily. 01/24/24  Yes Fender Herder, Hamp RAMAN, PA  albuterol  (PROVENTIL  HFA;VENTOLIN  HFA) 108 (90 Base) MCG/ACT inhaler Inhale 1-2 puffs into the lungs every 4 (four) hours as needed for wheezing or shortness of breath (or cough). Patient taking differently: Inhale 1-2 puffs into the lungs daily. 09/03/17   Street, [place], PA-C  albuterol  (PROVENTIL ) (2.5 MG/3ML) 0.083% nebulizer solution Take 2.5 mg by nebulization every 6 (six) hours as needed for wheezing or shortness of breath. 05/09/23   [provider]  hydrOXYzine (ATARAX) 25 MG tablet Take 25 mg by mouth See admin instructions. Take 25mg  (1 tablet) by mouth two to three times daily. 09/21/23   [provider]  ketorolac  (TORADOL ) 10 MG tablet Take 1 tablet (10 mg total) by mouth every 6 (six) hours as needed. 12/03/23   Jerrol Agent, MD  ondansetron  (ZOFRAN ) 4 MG tablet Take 1 tablet (4 mg  total) by mouth every 6 (six) hours as needed for nausea. 10/09/23   Cheryle Page, MD  oxyCODONE -acetaminophen  (PERCOCET) 7.5-325 MG tablet Take 1 tablet by mouth See admin instructions. Take 1 tablet by mouth four to six times a day 09/21/23   [provider]  polyethylene glycol (MIRALAX  / GLYCOLAX ) 17 g packet Take 17 g by mouth 2 (two) times daily. 10/09/23   Cheryle Page, MD  predniSONE  (DELTASONE ) 20 MG tablet Take 20 mg by mouth at bedtime. Until finished for 2 weeks.    [provider]  rosuvastatin  (CRESTOR ) 5 MG tablet Take 5 mg by mouth at bedtime.    [provider]  senna-docusate (SENOKOT-S) 8.6-50 MG tablet Place 1 tablet into feeding tube at bedtime as needed for mild constipation. 10/09/23   Cheryle Page, MD  Vitamin D, Ergocalciferol, (DRISDOL) 1.25 MG (50000 UNIT) CAPS capsule Take 50,000 Units by mouth once a week. 09/21/23   [provider]    Allergies: Ondansetron  and Penicillins    Review of Systems  Updated Vital Signs BP (!) 133/97 (BP Location: Left Arm)   Pulse 70   Temp 97.7 F (36.5 C) (Oral)   Resp 17   Ht 5' 7 (1.702 m)   Wt 77.1 kg   SpO2 98%   BMI 26.63 kg/m   Physical Exam Vitals and nursing note reviewed.  Constitutional:  General: He is not in acute distress. HENT:     Head: Normocephalic and atraumatic.     Nose: Nose normal.     Mouth/Throat:     Mouth: Mucous membranes are moist.  Eyes:     General: No scleral icterus. Cardiovascular:     Rate and Rhythm: Normal rate and regular rhythm.     Pulses: Normal pulses.     Heart sounds: Normal heart sounds.  Pulmonary:     Effort: Pulmonary effort is normal. No respiratory distress.     Breath sounds: No wheezing.  Abdominal:     Palpations: Abdomen is soft.     Tenderness: There is no abdominal tenderness.  Musculoskeletal:     Cervical back: Normal range of motion.     Right lower leg: No edema.     Left lower leg: No edema.     Comments:  No C or T-spine midline tenderness some diffuse lumbar tenderness along with some paravertebral muscular tenderness left greater than right  No significant bony tenderness of extremities left upper extremity with discomfort with range of motion of left shoulder  Bilateral radial artery pulses normal  Skin:    General: Skin is warm and dry.     Capillary Refill: Capillary refill takes less than 2 seconds.  Neurological:     Mental Status: He is alert. Mental status is at baseline.  Psychiatric:        Mood and Affect: Mood normal.        Behavior: Behavior normal.     (all labs ordered are listed, but only abnormal results are displayed) Labs Reviewed - No data to display  EKG: None  Radiology: DG Lumbar Spine Complete Result Date: 01/24/2024 CLINICAL DATA:  Lower back pain after fall last week. EXAM: LUMBAR SPINE - COMPLETE 4+ VIEW COMPARISON:  None Available. FINDINGS: There is no evidence of lumbar spine fracture. Alignment is normal. Intervertebral disc spaces are maintained. Minimal to mild anterior osteophyte formation is noted at L2-3, L3-4 and L4-5. IMPRESSION: Minimal to mild multilevel degenerative changes. No acute abnormality seen. Electronically Signed   By: Lynwood Landy Raddle M.D.   On: 01/24/2024 11:34   DG Hip Unilat With Pelvis 2-3 Views Left Result Date: 01/24/2024 CLINICAL DATA:  fall. EXAM: DG HIP (WITH OR WITHOUT PELVIS) 2-3V LEFT COMPARISON:  None Available. FINDINGS: Pelvis is intact with normal and symmetric sacroiliac joints. No acute fracture or dislocation. No aggressive osseous lesion. Visualized sacral arcuate lines are unremarkable. Unremarkable symphysis pubis. Unremarkable bilateral hip joints.  No significant arthritis. No radiopaque foreign bodies. IMPRESSION: No acute osseous abnormality of the pelvis or left hip joint. Electronically Signed   By: Ree Molt M.D.   On: 01/24/2024 10:42   DG Shoulder Left Result Date: 01/24/2024 CLINICAL DATA:  [id number]  Injury [id number] EXAM: LEFT SHOULDER - 2+ VIEW COMPARISON:  None Available. FINDINGS: No acute fracture or dislocation. No aggressive osseous lesion. Glenohumeral and acromioclavicular joints are normal in alignment. No significant arthritis. No soft tissue swelling. No radiopaque foreign bodies. IMPRESSION: No acute osseous abnormality of the left shoulder. Electronically Signed   By: Ree Molt M.D.   On: 01/24/2024 10:42     Procedures   Medications Ordered in the ED  lidocaine  (LIDODERM ) 5 % 1 patch (1 patch Transdermal Patch Applied 01/24/24 1121)  ketorolac  (TORADOL ) 30 MG/ML injection 30 mg (30 mg Intramuscular Given 01/24/24 1120)  Medical Decision Making Amount and/or Complexity of Data Reviewed Radiology: ordered.  Risk Prescription drug management.   Patient is a 48 year old male  Patient presents emergency room today after he slid down the steps 1 week ago.  He states that he stepped on a wet step on the outside porch and slipped to the ground fell on his buttocks and slid down several steps did not strike his head or lose consciousness no nausea or vomiting.  He indicates that he has some pain in his left hip and low back as well as his left shoulder.  He has a history of chronic pain takes Percocet for this.  Patient overall well-appearing does have some discomfort with range of motion of left shoulder and some lower back tenderness that is diffuse although he does have a trigger point of tenderness on the left paravertebral musculature of the lumbar region.  Not significantly tender midline lumbar spine.   Had shared decision making over sedation with patient about imaging his left hip x-ray and left shoulder x-ray unremarkable.  He is requesting lumbar spine x-ray I think this is not unreasonable given that he did have a fall however I have very low suspicion for fracture if negative will discharge home with muscle relaxer lidocaine  Derm  and recommendation to follow-up with primary care.  Patient given Toradol  and Lidoderm   Lumbar x-ray shows no acute fracture.  Patient is neurologically intact will discharge home ambulating without difficulty.   Final diagnoses:  Fall, initial encounter  Acute pain of left shoulder  Acute left-sided low back pain without sciatica    ED Discharge Orders          Ordered    methocarbamol  (ROBAXIN ) 500 MG tablet  2 times daily        01/24/24 1047    lidocaine  (LIDODERM ) 5 %  Every 24 hours        01/24/24 1047               Neldon Inoue Archbold, [place] 01/24/24 1308    Towana Ozell BROCKS, MD 01/24/24 1846

## 2024-02-25 ENCOUNTER — Encounter: Payer: Self-pay | Admitting: Radiology

## 2024-05-28 IMAGING — MR MR CERVICAL SPINE W/O CM
5 series · 41 of 48 positions shown · non-contrast
Comparison: No prior MRI, correlation is made with CT cervical
spine 03/14/2018

CLINICAL DATA: Neck pain, radiating to left shoulder and down left
arm

EXAM:
MRI CERVICAL SPINE WITHOUT CONTRAST
TECHNIQUE: Multiplanar, multisequence MR imaging of the cervical spine was
performed. No intravenous contrast was administered.

[Series 5: T2 · sagittal · 3.0mm · 0.82mm/px · 6 of 17 slices shown (1 of 2)]
[im 1/17]
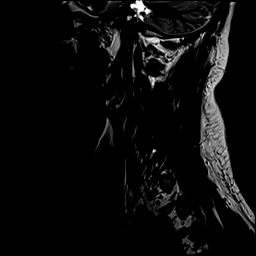
[im 4/17]
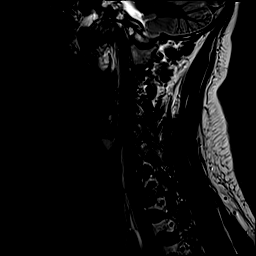
[im 7/17]
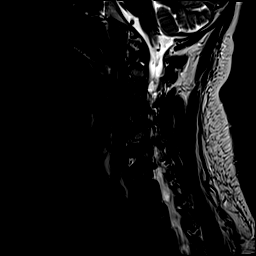
[im 10/17]
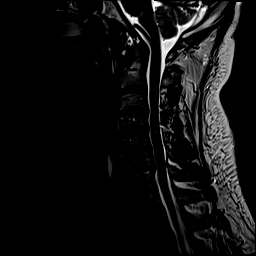
[im 13/17]
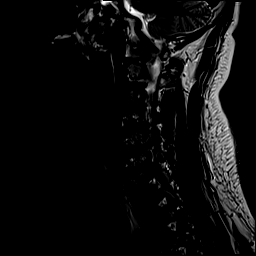
[im 17/17]
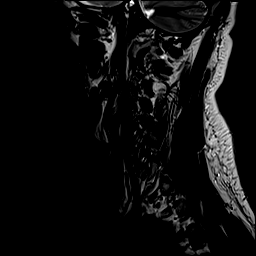

[Series 6: T1 · sagittal · 3.0mm · 0.82mm/px · 6 of 17 slices shown]
[im 1/17]
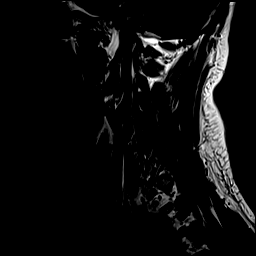
[im 4/17]
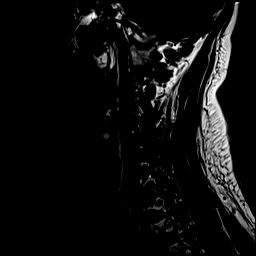
[im 7/17]
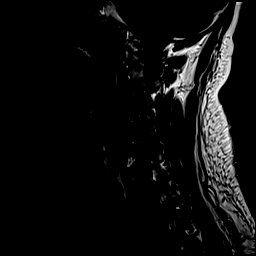
[im 10/17]
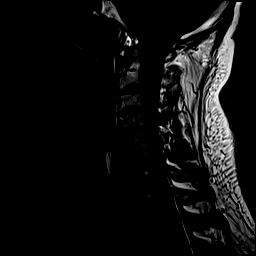
[im 13/17]
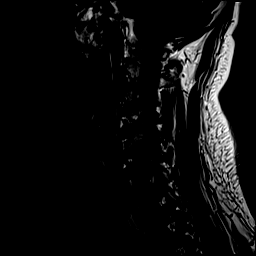
[im 17/17]
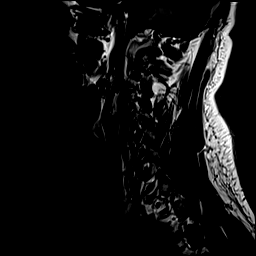

[Series 7: STIR · sagittal · 3.0mm · 0.41mm/px · 6 of 17 slices shown]
[im 1/17]
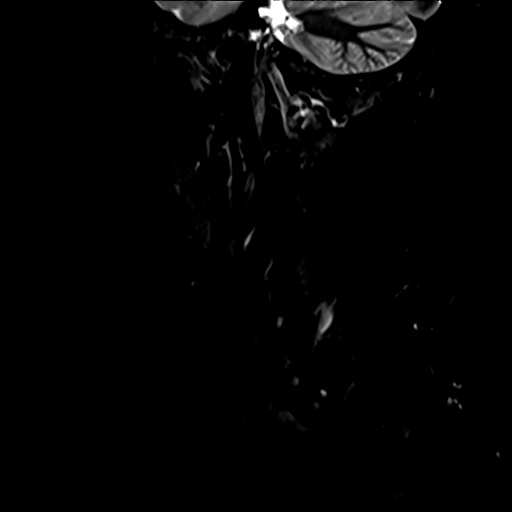
[im 4/17]
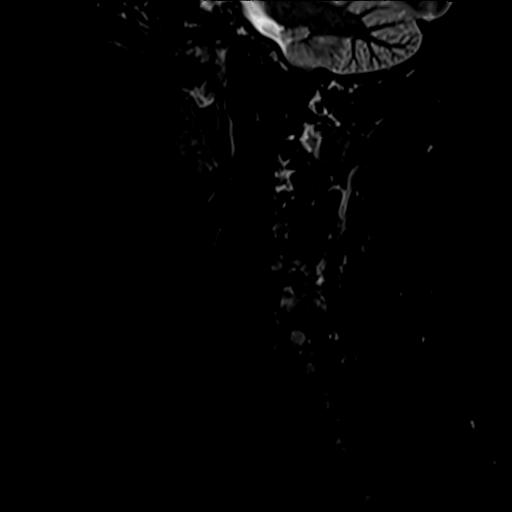
[im 7/17]
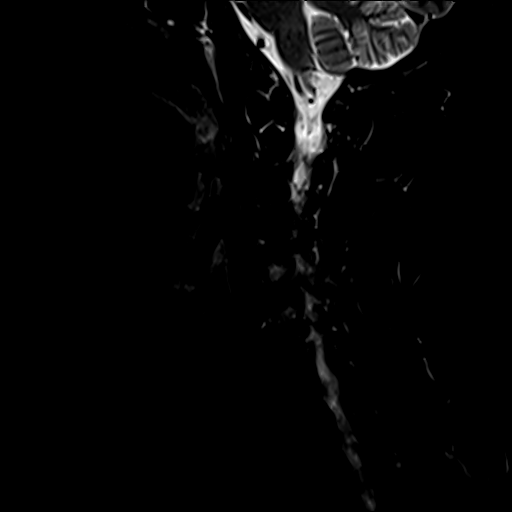
[im 10/17]
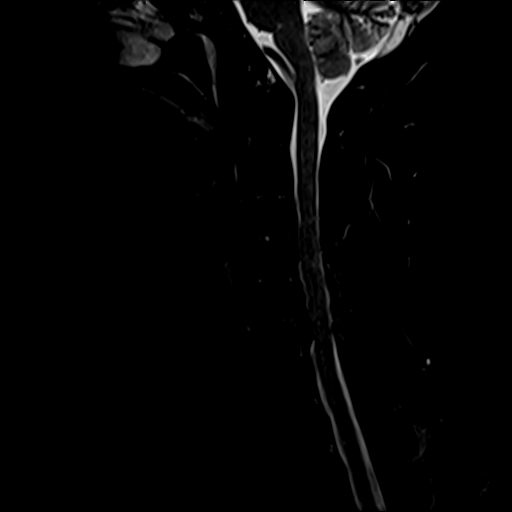
[im 13/17]
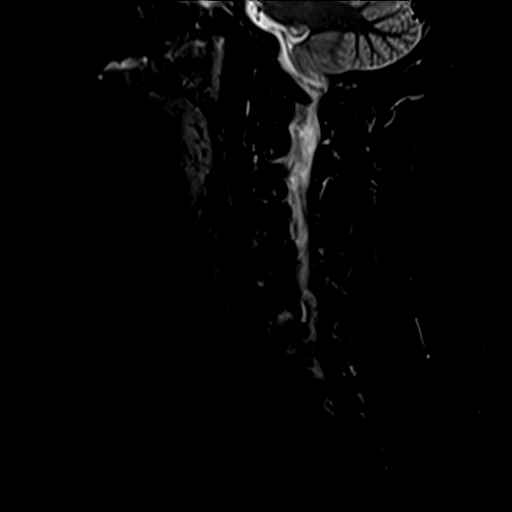
[im 17/17]
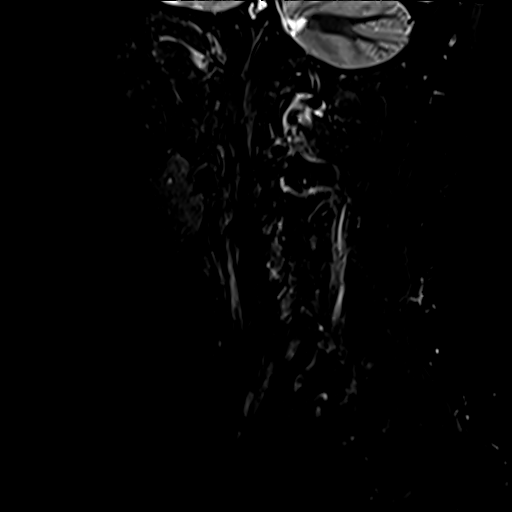

[Series 8: T2 · axial · 3.0mm · 0.62mm/px · z∈[-57,+78]mm · 15 of 43 slices shown (2 of 2)]
[im 1/43]
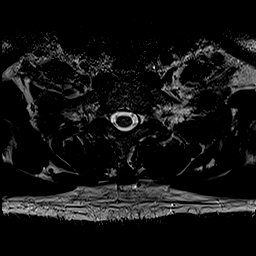
[im 4/43]
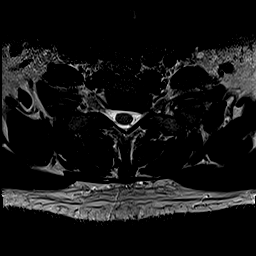
[im 7/43]
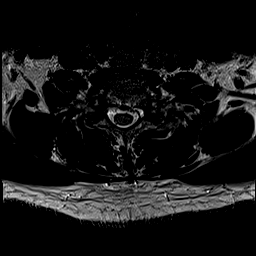
[im 10/43]
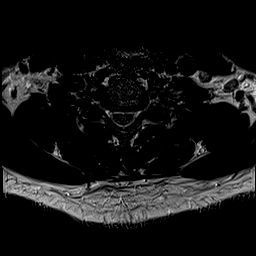
[im 13/43]
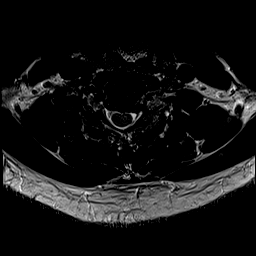
[im 16/43]
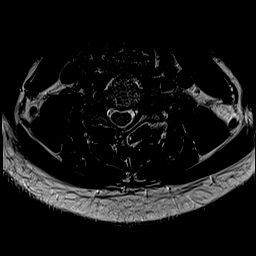
[im 19/43]
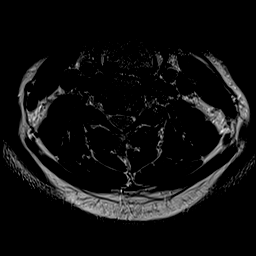
[im 22/43]
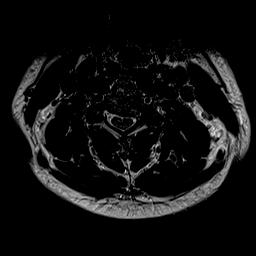
[im 25/43]
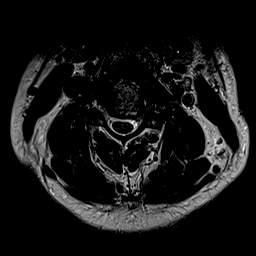
[im 28/43]
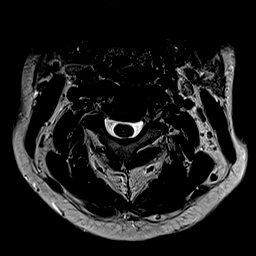
[im 31/43]
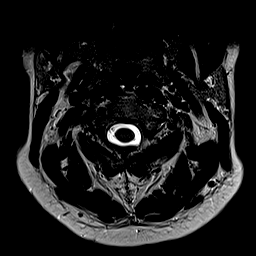
[im 34/43]
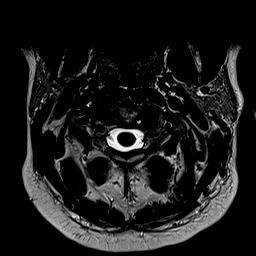
[im 37/43]
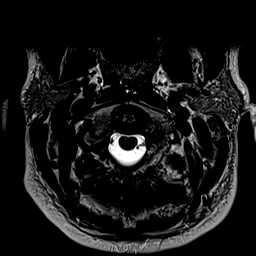
[im 40/43]
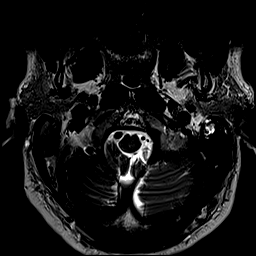
[im 43/43]
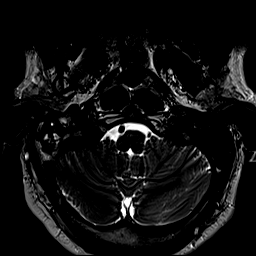

[Series 9: GRE · axial · 3.0mm · 0.42mm/px · z∈[-53,+84]mm · 8 of 43 slices shown]
[im 1/43]
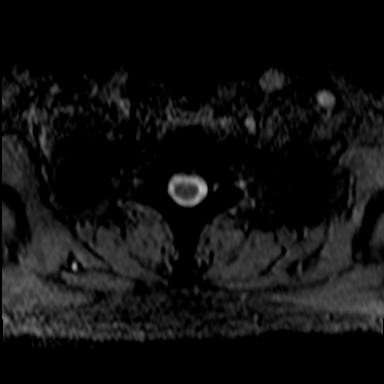
[im 7/43]
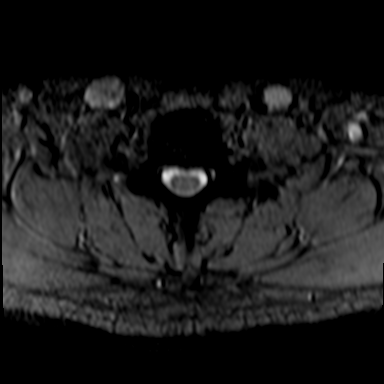
[im 13/43]
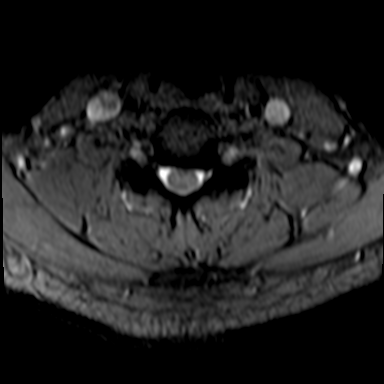
[im 19/43]
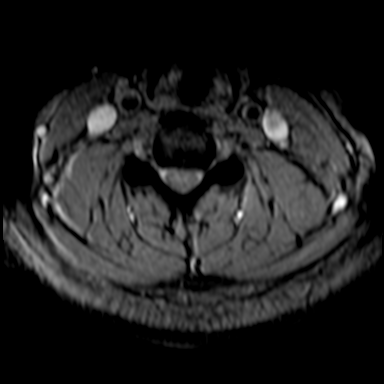
[im 25/43]
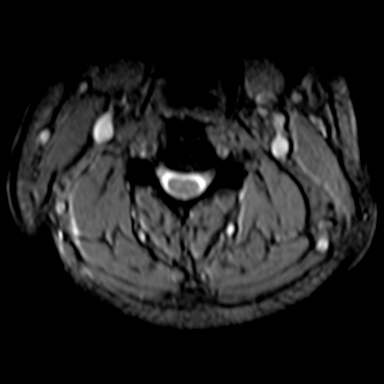
[im 31/43]
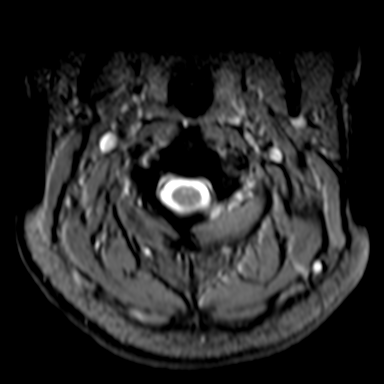
[im 37/43]
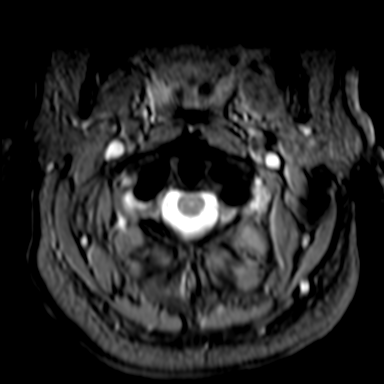
[im 43/43]
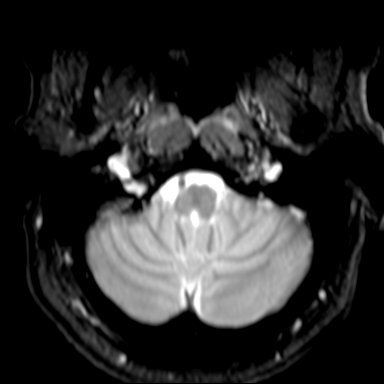

[41 of 48 positions shown; findings below may reference images not displayed]

FINDINGS: Alignment: Mild straightening of the normal cervical lordosis. No
listhesis.

Vertebrae: No acute fracture or suspicious osseous lesion. Endplate
degenerative changes at C6-C7. There is likely a benign hemangioma
in the C5 vertebral body. Congenitally short pedicles, which narrow
the AP diameter of the spinal canal.

Cord: Normal signal and morphology.

Posterior Fossa, vertebral arteries, paraspinal tissues: Negative.

Disc levels:

C2-C3: No significant disc bulge. No spinal canal stenosis or
neuroforaminal narrowing.

C3-C4: Minimal disc bulge. Left-greater-than-right facet and
uncovertebral hypertrophy. Mild spinal canal stenosis. Moderate to
severe left neural foraminal narrowing.

C4-C5: Minimal disc bulge. Left-greater-than-right facet and
uncovertebral hypertrophy. No spinal canal stenosis.
Mild-to-moderate right and moderate to severe left neural foraminal
narrowing.

C5-C6: Minimal disc bulge. Facet and uncovertebral hypertrophy. Mild
right and mild-to-moderate left neural foraminal narrowing.

C6-C7: Mild disc bulge. Moderate spinal canal stenosis. Facet and
uncovertebral hypertrophy. Moderate to severe left and moderate
right neural foraminal narrowing.

C7-T1: No significant disc bulge. Left uncovertebral hypertrophy. No
spinal canal stenosis. Mild left neural foraminal narrowing.
IMPRESSION: 1. Mild degenerative disc disease superimposed on short pedicles,
which causes moderate spinal canal stenosis at C6-C7 and mild spinal
canal stenosis at C3-C4.
2. Multilevel uncovertebral and facet arthropathy,
left-greater-than-right, which causes moderate to severe neural
foraminal narrowing on the left at C3-C4, C4-C5 and C6-C7.
